# Patient Record
Sex: Female | Born: 1980
Health system: Southern US, Community
[De-identification: ages and names within clinical notes are randomized; demographics above are authoritative.]

## PROBLEM LIST (undated history)

## (undated) DIAGNOSIS — E669 Obesity, unspecified: Secondary | ICD-10-CM

## (undated) DIAGNOSIS — O280 Abnormal hematological finding on antenatal screening of mother: Secondary | ICD-10-CM

## (undated) DIAGNOSIS — D229 Melanocytic nevi, unspecified: Secondary | ICD-10-CM

## (undated) DIAGNOSIS — E559 Vitamin D deficiency, unspecified: Secondary | ICD-10-CM

## (undated) DIAGNOSIS — R87629 Unspecified abnormal cytological findings in specimens from vagina: Secondary | ICD-10-CM

## (undated) HISTORY — DX: Obesity, unspecified: E66.9

## (undated) HISTORY — DX: Melanocytic nevi, unspecified: D22.9

## (undated) HISTORY — DX: Unspecified abnormal cytological findings in specimens from vagina: R87.629

## (undated) HISTORY — DX: Vitamin D deficiency, unspecified: E55.9

## (undated) HISTORY — PX: COLPOSCOPY: SHX161

## (undated) HISTORY — DX: Abnormal hematological finding on antenatal screening of mother: O28.0

## (undated) HISTORY — PX: WISDOM TOOTH EXTRACTION: SHX21

---

## 2003-10-25 ENCOUNTER — Other Ambulatory Visit: Admission: RE | Admit: 2003-10-25 | Discharge: 2003-10-25 | Payer: Self-pay | Admitting: Dermatology

## 2005-11-05 ENCOUNTER — Encounter (INDEPENDENT_AMBULATORY_CARE_PROVIDER_SITE_OTHER): Payer: Self-pay | Admitting: Specialist

## 2005-11-05 ENCOUNTER — Other Ambulatory Visit: Admission: RE | Admit: 2005-11-05 | Discharge: 2005-11-05 | Payer: Self-pay | Admitting: Unknown Physician Specialty

## 2006-08-05 ENCOUNTER — Other Ambulatory Visit: Admission: RE | Admit: 2006-08-05 | Discharge: 2006-08-05 | Payer: Self-pay | Admitting: Unknown Physician Specialty

## 2006-08-05 ENCOUNTER — Encounter (INDEPENDENT_AMBULATORY_CARE_PROVIDER_SITE_OTHER): Payer: Self-pay | Admitting: Unknown Physician Specialty

## 2007-05-05 ENCOUNTER — Other Ambulatory Visit: Admission: RE | Admit: 2007-05-05 | Discharge: 2007-05-05 | Payer: Self-pay | Admitting: Unknown Physician Specialty

## 2009-06-23 DIAGNOSIS — D229 Melanocytic nevi, unspecified: Secondary | ICD-10-CM

## 2009-06-23 HISTORY — DX: Melanocytic nevi, unspecified: D22.9

## 2010-12-13 ENCOUNTER — Other Ambulatory Visit: Payer: Self-pay | Admitting: Obstetrics and Gynecology

## 2010-12-13 ENCOUNTER — Other Ambulatory Visit: Payer: Self-pay

## 2010-12-13 DIAGNOSIS — N63 Unspecified lump in unspecified breast: Secondary | ICD-10-CM

## 2010-12-14 ENCOUNTER — Ambulatory Visit
Admission: RE | Admit: 2010-12-14 | Discharge: 2010-12-14 | Disposition: A | Discharge status: Home / Self Care | Payer: 59 | Source: Ambulatory Visit | Attending: Obstetrics and Gynecology | Admitting: Obstetrics and Gynecology

## 2010-12-14 DIAGNOSIS — N63 Unspecified lump in unspecified breast: Secondary | ICD-10-CM

## 2011-02-16 ENCOUNTER — Emergency Department (HOSPITAL_COMMUNITY)
Admission: EM | Admit: 2011-02-16 | Discharge: 2011-02-16 | Disposition: A | Discharge status: Home / Self Care | Payer: 59 | Source: Home / Self Care | Attending: Family Medicine | Admitting: Family Medicine

## 2011-02-16 DIAGNOSIS — J310 Chronic rhinitis: Secondary | ICD-10-CM

## 2011-02-16 DIAGNOSIS — J069 Acute upper respiratory infection, unspecified: Secondary | ICD-10-CM

## 2011-02-16 NOTE — ED Provider Notes (Signed)
History     CSN: [id number]  Arrival date & time 02/16/11  1011   First MD Initiated Contact with Patient 02/16/11 1018      Chief Complaint  Patient presents with  . Sore Throat    (Consider location/radiation/quality/duration/timing/severity/associated sxs/prior treatment) HPI Comments: Jessica Levine presents for evaluation of sore throat, bilateral ear itching, congestion. She denies any fever; she thinks she has allergies, but has not been formally diagnosed.  Patient is a 30 y.o. female presenting with pharyngitis. The history is provided by the patient.  Sore Throat This is a new problem. The current episode started 2 days ago. The problem occurs constantly. The problem has not changed since onset.The symptoms are aggravated by nothing. The symptoms are relieved by nothing. She has tried nothing for the symptoms.    History reviewed. No pertinent past medical history.  History reviewed. No pertinent past surgical history.  History reviewed. No pertinent family history.  History  Substance Use Topics  . Smoking status: Never Smoker   . Smokeless tobacco: Not on file  . Alcohol Use: Yes    OB History    Grav Para Term Preterm Abortions TAB SAB Ect Mult Living                  Review of Systems  Constitutional: Negative.   HENT: Positive for congestion, sore throat and rhinorrhea. Negative for ear pain and ear discharge.        Ear itching and fullness bilaterally  Eyes: Negative.   Respiratory: Negative.  Negative for cough.   Cardiovascular: Negative.   Gastrointestinal: Negative.   Genitourinary: Negative.   Musculoskeletal: Negative.   Skin: Negative.   Neurological: Negative.     Allergies  Review of patient's allergies indicates no known allergies.  Home Medications   Current Outpatient Rx  Name Route Sig Dispense Refill  . UNKNOWN TO PATIENT  ?birth control       BP 112/76  Pulse 85  Temp(Src) 98.4 F (36.9 C) (Oral)  Resp 18  SpO2 100%  LMP  02/12/2011  Physical Exam  Nursing note and vitals reviewed. Constitutional: She is oriented to person, place, and time. She appears well-developed and well-nourished.  HENT:  Head: Normocephalic and atraumatic.  Right Ear: Tympanic membrane is retracted.  Left Ear: Tympanic membrane is retracted.  Mouth/Throat: Uvula is midline, oropharynx is clear and moist and mucous membranes are normal.  Eyes: EOM are normal.  Neck: Normal range of motion.  Pulmonary/Chest: Effort normal and breath sounds normal. She has no wheezes. She has no rhonchi.  Musculoskeletal: Normal range of motion.  Neurological: She is alert and oriented to person, place, and time.  Skin: Skin is warm and dry.  Psychiatric: Her behavior is normal.    ED Course  Procedures (including critical care time)  Labs Reviewed - No data to display No results found.   1. URI (upper respiratory infection)   2. Rhinitis       MDM  Supportive care; no rx given        Richardo Priest, MD 02/16/11 1140

## 2011-08-19 ENCOUNTER — Other Ambulatory Visit (HOSPITAL_COMMUNITY): Payer: Self-pay | Admitting: General Surgery

## 2011-08-19 ENCOUNTER — Ambulatory Visit (HOSPITAL_COMMUNITY)
Admission: RE | Admit: 2011-08-19 | Discharge: 2011-08-19 | Disposition: A | Discharge status: Home / Self Care | Payer: Self-pay | Source: Ambulatory Visit | Attending: General Surgery | Admitting: General Surgery

## 2011-08-19 DIAGNOSIS — N390 Urinary tract infection, site not specified: Secondary | ICD-10-CM

## 2013-02-25 NOTE — L&D Delivery Note (Signed)
Delivery Note At 8:51 PM a viable and healthy female was delivered via Vaginal, Spontaneous Delivery (Presentation: ; Occiput Anterior).  APGAR: 8, 9; weight -pending Placenta status: Intact, Spontaneous.  Cord: 3 vessels with the following complications: None.  Cord pH: N/A  Anesthesia: Epidural  Episiotomy: None Lacerations: 2nd degree Suture Repair: 3.0 vicryl rapide ** Est. Blood Loss (mL): 500 ml.  Cytotec 800 mcg placed rectally and then methergine 0.2 mg given IM --> Watch for pph'hage   Mom to postpartum.  Baby to Couplet care / Skin to Skin.  Rika Daughdrill R 09/17/2013, 9:30 PM

## 2013-03-12 LAB — OB RESULTS CONSOLE GC/CHLAMYDIA
Chlamydia: NEGATIVE
GC PROBE AMP, GENITAL: NEGATIVE

## 2013-03-12 LAB — OB RESULTS CONSOLE ABO/RH: RH Type: POSITIVE

## 2013-03-12 LAB — OB RESULTS CONSOLE RPR: RPR: NONREACTIVE

## 2013-03-12 LAB — OB RESULTS CONSOLE HEPATITIS B SURFACE ANTIGEN: Hepatitis B Surface Ag: NEGATIVE

## 2013-03-12 LAB — OB RESULTS CONSOLE HIV ANTIBODY (ROUTINE TESTING): HIV: NONREACTIVE

## 2013-03-12 LAB — OB RESULTS CONSOLE ANTIBODY SCREEN: Antibody Screen: NEGATIVE

## 2013-03-12 LAB — OB RESULTS CONSOLE RUBELLA ANTIBODY, IGM: Rubella: IMMUNE

## 2013-08-17 LAB — OB RESULTS CONSOLE GBS: STREP GROUP B AG: NEGATIVE

## 2013-09-14 ENCOUNTER — Telehealth (HOSPITAL_COMMUNITY): Payer: Self-pay | Admitting: *Deleted

## 2013-09-14 ENCOUNTER — Encounter (HOSPITAL_COMMUNITY): Payer: Self-pay | Admitting: *Deleted

## 2013-09-14 NOTE — Telephone Encounter (Signed)
Preadmission screen  

## 2013-09-16 ENCOUNTER — Other Ambulatory Visit: Payer: Self-pay | Admitting: Obstetrics & Gynecology

## 2013-09-17 ENCOUNTER — Inpatient Hospital Stay (HOSPITAL_COMMUNITY): Payer: 59 | Admitting: Anesthesiology

## 2013-09-17 ENCOUNTER — Encounter (HOSPITAL_COMMUNITY): Payer: 59 | Admitting: Anesthesiology

## 2013-09-17 ENCOUNTER — Inpatient Hospital Stay (HOSPITAL_COMMUNITY): Admission: AD | Admit: 2013-09-17 | Payer: 59 | Source: Ambulatory Visit | Admitting: Obstetrics & Gynecology

## 2013-09-17 ENCOUNTER — Inpatient Hospital Stay (HOSPITAL_COMMUNITY)
Admission: RE | Admit: 2013-09-17 | Discharge: 2013-09-19 | DRG: 774 | Disposition: A | Discharge status: Home / Self Care | Payer: 59 | Source: Ambulatory Visit | Attending: Obstetrics & Gynecology | Admitting: Obstetrics & Gynecology

## 2013-09-17 ENCOUNTER — Encounter (HOSPITAL_COMMUNITY): Payer: Self-pay

## 2013-09-17 DIAGNOSIS — Z3490 Encounter for supervision of normal pregnancy, unspecified, unspecified trimester: Secondary | ICD-10-CM

## 2013-09-17 DIAGNOSIS — Z833 Family history of diabetes mellitus: Secondary | ICD-10-CM

## 2013-09-17 DIAGNOSIS — Z6835 Body mass index (BMI) 35.0-35.9, adult: Secondary | ICD-10-CM

## 2013-09-17 DIAGNOSIS — E669 Obesity, unspecified: Secondary | ICD-10-CM | POA: Diagnosis present

## 2013-09-17 DIAGNOSIS — O322XX0 Maternal care for transverse and oblique lie, not applicable or unspecified: Principal | ICD-10-CM | POA: Diagnosis present

## 2013-09-17 DIAGNOSIS — O99214 Obesity complicating childbirth: Secondary | ICD-10-CM

## 2013-09-17 DIAGNOSIS — O320XX0 Maternal care for unstable lie, not applicable or unspecified: Secondary | ICD-10-CM | POA: Diagnosis present

## 2013-09-17 LAB — CBC
HEMATOCRIT: 36.8 % (ref 36.0–46.0)
Hemoglobin: 12.4 g/dL (ref 12.0–15.0)
MCH: 29.6 pg (ref 26.0–34.0)
MCHC: 33.7 g/dL (ref 30.0–36.0)
MCV: 87.8 fL (ref 78.0–100.0)
PLATELETS: 167 10*3/uL (ref 150–400)
RBC: 4.19 MIL/uL (ref 3.87–5.11)
RDW: 14.9 % (ref 11.5–15.5)
WBC: 9.8 10*3/uL (ref 4.0–10.5)

## 2013-09-17 LAB — ABO/RH: ABO/RH(D): A POS

## 2013-09-17 LAB — TYPE AND SCREEN
ABO/RH(D): A POS
ANTIBODY SCREEN: NEGATIVE

## 2013-09-17 LAB — RPR

## 2013-09-17 MED ORDER — DIBUCAINE 1 % RE OINT
1.0000 "application " | TOPICAL_OINTMENT | RECTAL | Status: DC | PRN
  Administered 2013-09-18: 1 via RECTAL
  Filled 2013-09-17: qty 28

## 2013-09-17 MED ORDER — PHENYLEPHRINE 40 MCG/ML (10ML) SYRINGE FOR IV PUSH (FOR BLOOD PRESSURE SUPPORT)
80.0000 ug | PREFILLED_SYRINGE | INTRAVENOUS | Status: DC | PRN
  Filled 2013-09-17: qty 2

## 2013-09-17 MED ORDER — SENNOSIDES-DOCUSATE SODIUM 8.6-50 MG PO TABS
2.0000 | ORAL_TABLET | ORAL | Status: DC
  Administered 2013-09-18 (×2): 2 via ORAL
  Filled 2013-09-17 (×2): qty 2

## 2013-09-17 MED ORDER — OXYTOCIN 40 UNITS IN LACTATED RINGERS INFUSION - SIMPLE MED
1.0000 m[IU]/min | INTRAVENOUS | Status: DC
  Administered 2013-09-17: 2 m[IU]/min via INTRAVENOUS
  Filled 2013-09-17: qty 1000

## 2013-09-17 MED ORDER — LACTATED RINGERS IV SOLN: 500.0000 mL | Freq: Once | INTRAVENOUS | Status: DC

## 2013-09-17 MED ORDER — IBUPROFEN 600 MG PO TABS: 600.0000 mg | ORAL_TABLET | Freq: Four times a day (QID) | ORAL | Status: DC | PRN

## 2013-09-17 MED ORDER — OXYCODONE-ACETAMINOPHEN 5-325 MG PO TABS
1.0000 | ORAL_TABLET | ORAL | Status: DC | PRN
  Administered 2013-09-18 – 2013-09-19 (×7): 1 via ORAL
  Filled 2013-09-17 (×7): qty 1

## 2013-09-17 MED ORDER — EPHEDRINE 5 MG/ML INJ
10.0000 mg | INTRAVENOUS | Status: DC | PRN
  Filled 2013-09-17: qty 2
  Filled 2013-09-17: qty 4

## 2013-09-17 MED ORDER — MISOPROSTOL 25 MCG QUARTER TABLET
25.0000 ug | ORAL_TABLET | ORAL | Status: DC | PRN
  Filled 2013-09-17: qty 1

## 2013-09-17 MED ORDER — OXYTOCIN BOLUS FROM INFUSION
500.0000 mL | INTRAVENOUS | Status: DC
  Administered 2013-09-17: 500 mL via INTRAVENOUS

## 2013-09-17 MED ORDER — ONDANSETRON HCL 4 MG PO TABS: 4.0000 mg | ORAL_TABLET | ORAL | Status: DC | PRN

## 2013-09-17 MED ORDER — LIDOCAINE HCL (PF) 1 % IJ SOLN
30.0000 mL | INTRAMUSCULAR | Status: DC | PRN
  Filled 2013-09-17: qty 30

## 2013-09-17 MED ORDER — BENZOCAINE-MENTHOL 20-0.5 % EX AERO
1.0000 | INHALATION_SPRAY | CUTANEOUS | Status: DC | PRN
Start: 2013-09-17 — End: 2013-09-19
  Administered 2013-09-18: 1 via TOPICAL
  Filled 2013-09-17: qty 56

## 2013-09-17 MED ORDER — FENTANYL 2.5 MCG/ML BUPIVACAINE 1/10 % EPIDURAL INFUSION (WH - ANES): 14.0000 mL/h | INTRAMUSCULAR | Status: DC | PRN

## 2013-09-17 MED ORDER — ONDANSETRON HCL 4 MG/2ML IJ SOLN
4.0000 mg | Freq: Four times a day (QID) | INTRAMUSCULAR | Status: DC | PRN
  Administered 2013-09-17: 4 mg via INTRAVENOUS
  Filled 2013-09-17: qty 2

## 2013-09-17 MED ORDER — WITCH HAZEL-GLYCERIN EX PADS
1.0000 | MEDICATED_PAD | CUTANEOUS | Status: DC | PRN
Start: 2013-09-17 — End: 2013-09-19

## 2013-09-17 MED ORDER — EPHEDRINE 5 MG/ML INJ
10.0000 mg | INTRAVENOUS | Status: DC | PRN
  Filled 2013-09-17: qty 2

## 2013-09-17 MED ORDER — DIPHENHYDRAMINE HCL 50 MG/ML IJ SOLN
12.5000 mg | INTRAMUSCULAR | Status: DC | PRN
  Administered 2013-09-17: 12.5 mg via INTRAVENOUS
  Filled 2013-09-17: qty 1

## 2013-09-17 MED ORDER — MISOPROSTOL 200 MCG PO TABS: 800.0000 ug | ORAL_TABLET | Freq: Once | ORAL | Status: DC

## 2013-09-17 MED ORDER — PRENATAL MULTIVITAMIN CH
1.0000 | ORAL_TABLET | Freq: Every day | ORAL | Status: DC
  Administered 2013-09-18 – 2013-09-19 (×2): 1 via ORAL
  Filled 2013-09-17 (×2): qty 1

## 2013-09-17 MED ORDER — DIPHENHYDRAMINE HCL 25 MG PO CAPS: 25.0000 mg | ORAL_CAPSULE | Freq: Four times a day (QID) | ORAL | Status: DC | PRN

## 2013-09-17 MED ORDER — LACTATED RINGERS IV SOLN: 500.0000 mL | INTRAVENOUS | Status: DC | PRN

## 2013-09-17 MED ORDER — ZOLPIDEM TARTRATE 5 MG PO TABS: 5.0000 mg | ORAL_TABLET | Freq: Every evening | ORAL | Status: DC | PRN

## 2013-09-17 MED ORDER — TETANUS-DIPHTH-ACELL PERTUSSIS 5-2.5-18.5 LF-MCG/0.5 IM SUSP: 0.5000 mL | Freq: Once | INTRAMUSCULAR | Status: DC

## 2013-09-17 MED ORDER — MISOPROSTOL 200 MCG PO TABS
ORAL_TABLET | ORAL | Status: AC
  Administered 2013-09-17: 800 ug via RECTAL
  Filled 2013-09-17: qty 5

## 2013-09-17 MED ORDER — IBUPROFEN 600 MG PO TABS
600.0000 mg | ORAL_TABLET | Freq: Four times a day (QID) | ORAL | Status: DC
  Administered 2013-09-18 – 2013-09-19 (×7): 600 mg via ORAL
  Filled 2013-09-17 (×7): qty 1

## 2013-09-17 MED ORDER — MISOPROSTOL 25 MCG QUARTER TABLET
25.0000 ug | ORAL_TABLET | ORAL | Status: DC | PRN
  Administered 2013-09-17: 25 ug via VAGINAL
  Filled 2013-09-17: qty 1
  Filled 2013-09-17: qty 0.25

## 2013-09-17 MED ORDER — TERBUTALINE SULFATE 1 MG/ML IJ SOLN
0.2500 mg | Freq: Once | INTRAMUSCULAR | Status: DC | PRN
Start: 2013-09-17 — End: 2013-09-17

## 2013-09-17 MED ORDER — OXYTOCIN 40 UNITS IN LACTATED RINGERS INFUSION - SIMPLE MED
62.5000 mL/h | INTRAVENOUS | Status: DC
  Administered 2013-09-17: 62.5 mL/h via INTRAVENOUS

## 2013-09-17 MED ORDER — TERBUTALINE SULFATE 1 MG/ML IJ SOLN: 0.2500 mg | Freq: Once | INTRAMUSCULAR | Status: DC | PRN

## 2013-09-17 MED ORDER — CITRIC ACID-SODIUM CITRATE 334-500 MG/5ML PO SOLN: 30.0000 mL | ORAL | Status: DC | PRN

## 2013-09-17 MED ORDER — METHYLERGONOVINE MALEATE 0.2 MG/ML IJ SOLN
INTRAMUSCULAR | Status: AC
  Administered 2013-09-17: 0.2 mg
  Filled 2013-09-17: qty 1

## 2013-09-17 MED ORDER — SIMETHICONE 80 MG PO CHEW
80.0000 mg | CHEWABLE_TABLET | ORAL | Status: DC | PRN
Start: 2013-09-17 — End: 2013-09-19

## 2013-09-17 MED ORDER — LACTATED RINGERS IV SOLN
INTRAVENOUS | Status: DC
  Administered 2013-09-17 (×3): via INTRAVENOUS

## 2013-09-17 MED ORDER — ACETAMINOPHEN 325 MG PO TABS: 650.0000 mg | ORAL_TABLET | ORAL | Status: DC | PRN

## 2013-09-17 MED ORDER — FLEET ENEMA 7-19 GM/118ML RE ENEM: 1.0000 | ENEMA | RECTAL | Status: DC | PRN

## 2013-09-17 MED ORDER — ONDANSETRON HCL 4 MG/2ML IJ SOLN: 4.0000 mg | INTRAMUSCULAR | Status: DC | PRN

## 2013-09-17 MED ORDER — PHENYLEPHRINE 40 MCG/ML (10ML) SYRINGE FOR IV PUSH (FOR BLOOD PRESSURE SUPPORT)
80.0000 ug | PREFILLED_SYRINGE | INTRAVENOUS | Status: DC | PRN
  Filled 2013-09-17: qty 10
  Filled 2013-09-17: qty 2

## 2013-09-17 MED ORDER — OXYCODONE-ACETAMINOPHEN 5-325 MG PO TABS: 1.0000 | ORAL_TABLET | ORAL | Status: DC | PRN

## 2013-09-17 MED ORDER — LANOLIN HYDROUS EX OINT: TOPICAL_OINTMENT | CUTANEOUS | Status: DC | PRN

## 2013-09-17 MED ORDER — FENTANYL 2.5 MCG/ML BUPIVACAINE 1/10 % EPIDURAL INFUSION (WH - ANES)
14.0000 mL/h | INTRAMUSCULAR | Status: DC | PRN
  Filled 2013-09-17: qty 125

## 2013-09-17 NOTE — Progress Notes (Signed)
Jessica Levine is a 33 y.o. G3P2002 at [redacted]w[redacted]d, IOL dor dates, high unengaged station at admission, now unstable, oblique lie. S/p Epidural and is comfortable. Decreased pitocin to allow rotation in exaggerated Sims with Peanut ball.   BP 100/55  Pulse 84  Temp(Src) 98.2 F (36.8 C) (Oral)  Resp 18  Ht 5\' 5"  (1.651 m)  Wt 215 lb (97.523 kg)  BMI 35.78 kg/m2  SpO2 96%  LMP 12/11/2012  FHT:  FHR: 150 bpm, variability: moderate,  accelerations:  Present,  decelerations:  Absent UC:   regular, every 3 minutes, now spacing out with pitocin turned down from 8 to 2 mu rate.   SVE:   Dilation: 5 Effacement (%): 60 Station: -2 Exam by:: Markeisha Mancias Vtx still above cervix but seems deflexed and off the left side. Back is on right so in case baby turned to transverse lie, cervical os should still stay covered and not allow cord prolapse, though risk present and d/w pt.  Increased C/s risk reviewed if not turning longitudinal and started entering pelvic inlet. EFW 7.1/2 lbs (last kid SVD was 8'14")   [name] R 09/17/2013, 4:55 PM

## 2013-09-17 NOTE — Progress Notes (Signed)
Jessica Levine is a 33 y.o. G3P2002 at [redacted]w[redacted]d, IOL for dates, high unengaged station at admission, control AROM, low dose pitocin, abdominal binder, then exaggerated Sim, Peanut ball, all options tried to get fetus in longitudinal lie and help descent in pelvic inlet. Epidural and is comfortable mostly but some discomfort.   VS stable. FHT:  FHR: 150 bpm, variability: moderate,  accelerations:  Present,  decelerations:  Absent - occasional decels/ variable like, no concerns UC:   regular, every 3 minutes on low dose pitocin   SVE:   Dilation: 8 Effacement (%): 80 Station: -2 Exam by:: Dr. Benjie Karvonen Vtx more central at pelvic inlet and cervix well applied!!  Continue to turn in exaggerated Sims on alternating side now to help with descent, anticipate SVD.    Jessica Levine 09/17/2013, 8:05 PM

## 2013-09-17 NOTE — H&P (Signed)
Jessica Levine is a 33 y.o. female presenting for labor IOL at 40 wks per maternal request and for oblique high stable, possible unstable lie.  Pt is G3P2002, with 8'14" SVD in past. This pregnancy care from 8 wks, abn QUAD for Down's but Infomaseq was normal. Normal labs, glucola, wt gain and GBS negative.   History OB History   Grav Para Term Preterm Abortions TAB SAB Ect Mult Living   3 2 2       2      Past Medical History  Diagnosis Date  . Obesity   . Abnormal quad screen   . Vaginal Pap smear, abnormal    Past Surgical History  Procedure Laterality Date  . Colposcopy     Family History: family history includes Diabetes in her maternal grandmother; Hearing loss in her maternal grandmother. Social History:  reports that she has never smoked. She has never used smokeless tobacco. She reports that she drinks alcohol. Her drug history is not on file.   Prenatal Transfer Tool  Maternal Diabetes: No Genetic Screening: Abnormal:  Results: Elevated risk of Trisomy 21 Maternal Ultrasounds/Referrals: Normal Fetal Ultrasounds or other Referrals:  None Maternal Substance Abuse:  No Significant Maternal Medications:  None Significant Maternal Lab Results:  Lab values include: Group B Strep negative Other Comments:  Informaseq cfFDNA negative  ROS  Neg    Dilation: 4 Effacement (%): 60 Station: -2 Exam by:: Phillipe Clemon Blood pressure 108/71, pulse 95, temperature 98.2 F (36.8 C), temperature source Oral, resp. rate 18, height 5\' 5"  (1.651 m), weight 215 lb (97.523 kg), last menstrual period 12/11/2012, SpO2 96.00%. Exam Physical Exam  A&O x 3, no acute distress. Pleasant HEENT neg, no thyromegaly Lungs CTA bilat CV RRR, S1S2 normal Abdo soft, non tender, non acute, Gravid pendulous uterus with unengaded station with oblique lie, head in LLQ and buttock in RUQ.  Extr no edema/ tenderness Pelvic 3-4, controlled AROM done with fundal pressure on the buttuck and keeping baby  longitudinal./ FHT  140s/ reactive Toco q 3-4 min with pitocin/   Belly binder placed to keep fetus more longitudinal and allow descent. Head feels unflexed, floating above and to the left of the inlet.   Prenatal labs: ABO, Rh: --/--/A POS, A POS (07/24 0740) Antibody: NEG (07/24 0740) Rubella: Immune (01/16 0000) RPR: NON REAC (07/24 0818)  HBsAg: Negative (01/16 0000)  HIV: Non-reactive (01/16 0000)  GBS: Negative (06/23 0000)   Assessment/Plan: 33 yo G3P2002 at 40 wks for labor IOL. Unstable oblique lie. Will continue to align the axis of the uterus and help decent, check for cord prolapse, not at present. Continue pitocin, tolerating well.  EFW 7.1/2 -8 lbs. Pelvic adequate for 8'14" in past.   Brannen Koppen R

## 2013-09-17 NOTE — Anesthesia Preprocedure Evaluation (Signed)
Anesthesia Evaluation    Airway       Dental   Pulmonary          Cardiovascular     Neuro/Psych    GI/Hepatic   Endo/Other  Morbid obesity  Renal/GU      Musculoskeletal   Abdominal   Peds  Hematology   Anesthesia Other Findings   Reproductive/Obstetrics                           Anesthesia Physical Anesthesia Plan  ASA: III  Anesthesia Plan: Epidural   Post-op Pain Management:    Induction:   Airway Management Planned:   Additional Equipment:   Intra-op Plan:   Post-operative Plan:   Informed Consent: I have reviewed the patients History and Physical, chart, labs and discussed the procedure including the risks, benefits and alternatives for the proposed anesthesia with the patient or authorized representative who has indicated his/her understanding and acceptance.   Dental Advisory Given  Plan Discussed with:   Anesthesia Plan Comments: (Labs checked- platelets confirmed with RN in room. Fetal heart tracing, per RN, reported to be stable enough for sitting procedure. Discussed epidural, and patient consents to the procedure:  included risk of possible headache,backache, failed block, allergic reaction, and nerve injury. This patient was asked if she had any questions or concerns before the procedure started.)        Anesthesia Quick Evaluation

## 2013-09-18 LAB — CBC
HEMATOCRIT: 36.3 % (ref 36.0–46.0)
HEMOGLOBIN: 12.3 g/dL (ref 12.0–15.0)
MCH: 29.7 pg (ref 26.0–34.0)
MCHC: 33.9 g/dL (ref 30.0–36.0)
MCV: 87.7 fL (ref 78.0–100.0)
Platelets: 170 10*3/uL (ref 150–400)
RBC: 4.14 MIL/uL (ref 3.87–5.11)
RDW: 14.8 % (ref 11.5–15.5)
WBC: 16.2 10*3/uL — AB (ref 4.0–10.5)

## 2013-09-18 MED ORDER — LIDOCAINE HCL (PF) 1 % IJ SOLN
INTRAMUSCULAR | Status: DC | PRN
  Administered 2013-09-17: 10 mL

## 2013-09-18 MED ORDER — FENTANYL 2.5 MCG/ML BUPIVACAINE 1/10 % EPIDURAL INFUSION (WH - ANES)
INTRAMUSCULAR | Status: DC | PRN
  Administered 2013-09-17: 14 mL/h via EPIDURAL

## 2013-09-18 NOTE — Anesthesia Postprocedure Evaluation (Signed)
   Post-op Vital Signs:   Last Vitals:  Filed Vitals:   09/18/13 0337  BP: 127/79  Pulse: 75  Temp: 36.8 C  Resp: 20    Complications:  Anesthesia Post Note  Patient: Jessica Levine  Procedure(s) Performed: * No procedures listed *  Anesthesia type: Epidural  Patient location: Mother/Baby  Post pain: Pain level controlled  Post assessment: Post-op Vital signs reviewed  Last Vitals:  Filed Vitals:   09/18/13 0337  BP: 127/79  Pulse: 75  Temp: 36.8 C  Resp: 20    Post vital signs: Reviewed  Level of consciousness:alert  Complications: No apparent anesthesia complications

## 2013-09-18 NOTE — Anesthesia Procedure Notes (Signed)

## 2013-09-18 NOTE — Progress Notes (Signed)
Patient ID: Jessica Levine, female   DOB: [birth year], 33 y.o.   MRN: [id number] PPD # 1 SVD  S:  Reports feeling tired, but well             Tolerating po/ No nausea or vomiting             Bleeding is light             Pain controlled with ibuprofen (OTC)             Up ad lib / ambulatory / voiding without difficulties    Newborn  Information for the patient's newborn:  Claribel, Sachs [[id number]]  female  breast feeding   O:  A & O x 3, in no apparent distress              VS:  Filed Vitals:   09/17/13 2255 09/18/13 0000 09/18/13 0337 09/18/13 1130  BP: 123/74 121/81 127/79 103/63  Pulse: 75 80 75 72  Temp: 99.9 F (37.7 C) 98.8 F (37.1 C) 98.3 F (36.8 C) 98.4 F (36.9 C)  TempSrc: Oral Oral Oral Oral  Resp: 20 20 20 18   Height:      Weight:      SpO2:        LABS:  Recent Labs  09/17/13 0818 09/18/13 0605  WBC 9.8 16.2*  HGB 12.4 12.3  HCT 36.8 36.3  PLT 167 170    Blood type: --/--/A POS, A POS (07/24 0740)  Rubella: Immune (01/16 0000)   I&O: I/O last 3 completed shifts: In: -  Out: 1800 [Urine:1300; Blood:500]             Lungs: Clear and unlabored  Heart: regular rate and rhythm / no murmurs  Abdomen: soft, non-tender, non-distended              Fundus: firm, non-tender, U-2  Perineum: 2nd degree repair healing well  Lochia: minimal  Extremities: no edema, no calf pain or tenderness, no Homans    A/P: PPD # 1  33 y.o., [id number]   Principal Problem:    Postpartum care following vaginal delivery (7/24)   Doing well - stable status  Routine post partum orders  Anticipate discharge tomorrow    Laury Deep, M, MSN, CNM 09/18/2013, 1:45 PM

## 2013-09-19 MED ORDER — IBUPROFEN 600 MG PO TABS: 600.0000 mg | ORAL_TABLET | Freq: Four times a day (QID) | ORAL | Status: DC

## 2013-09-19 MED ORDER — OXYCODONE-ACETAMINOPHEN 5-325 MG PO TABS: 1.0000 | ORAL_TABLET | ORAL | Status: DC | PRN

## 2013-09-19 NOTE — Discharge Summary (Signed)
Obstetric Discharge Summary  Reason for Admission: Pt is a G3P3003 at [redacted]w[redacted]d IOL per patient request.  Patient has received care at [facility] since 8 wks, with Dr. Benjie Karvonen as primary provider.  Medications on Admission: Prescriptions prior to admission  Medication Sig Dispense Refill  . Cholecalciferol (VITAMIN D PO) Take 1 tablet by mouth daily.      . Prenatal Vit-Fe Fumarate-FA (PRENATAL MULTIVITAMIN) TABS tablet Take 1 tablet by mouth daily at 12 noon.        Prenatal Labs: ABO, Rh: A POS, A POS (07/24 0740) Antibody: NEG (07/24 0740) Rubella: Immune (01/16 0000)   RPR: NON REAC (07/24 0818)  HBsAg: Negative (01/16 0000)  HIV: Non-reactive (01/16 0000)  GTT : 135 mg/dL GBS: Negative (06/23 0000)   Prenatal Procedures: NST and ultrasound Intrapartum Procedures: spontaneous vaginal delivery Postpartum Procedures: none Complications-Operative and Postpartum: 2nd degree perineal laceration and hemorrhage - EBL 500 mL  Labs: Hemoglobin  Date Value Ref Range Status  09/18/2013 12.3  12.0 - 15.0 g/dL Final     HCT  Date Value Ref Range Status  09/18/2013 36.3  36.0 - 46.0 % Final   Lab Results  Component Value Date   PLT 170 09/18/2013    Newborn Data: Live born female on 09/17/2013 Birth Weight: 9 lb 1.9 oz (4135 g) APGAR: 8, 9  Home with mother.   Discharge Information: Date: 09/19/2013 Discharge Diagnoses:  Pt is a G3P3003 at [redacted]w[redacted]d S/P Term Pregnancy-delivered on 09/17/2013  Condition: stable Activity: pelvic rest Diet: routine Medications:    Medication List         ibuprofen 600 MG tablet  Commonly known as:  ADVIL,MOTRIN  Take 1 tablet (600 mg total) by mouth every 6 (six) hours.     prenatal multivitamin Tabs tablet  Take 1 tablet by mouth daily at 12 noon.     VITAMIN D PO  Take 1 tablet by mouth daily.       Instructions: [facility] OB/GYN instruction booklet has been given and reviewed Discharge to: home     Follow-up Information   Follow up with MODY,VAISHALI R, MD. Schedule an appointment as soon as possible for a visit in 6 weeks. (For postpartum visit)    Specialty:  Obstetrics and Gynecology   Contact information:   [address] [place] [address] [phone]       Laury Deep, Jerilynn Mages, MSN, CNM 09/19/2013, 7:23 AM

## 2013-09-19 NOTE — Discharge Instructions (Signed)
Breast Pumping Tips °If you are breastfeeding, there may be times when you cannot feed your baby directly. Returning to work or going on a trip are common examples. Pumping allows you to store breast milk and feed it to your baby later.  °You may not get much milk when you first start to pump. Your breasts should start to make more after a few days. If you pump at the times you usually feed your baby, you may be able to keep making enough milk to feed your baby without also using formula. The more often you pump, the more milk you will produce.  °WHEN SHOULD I PUMP?  °· You can begin to pump soon after delivery. However, some experts recommend waiting about 4 weeks before giving your infant a bottle to make sure breastfeeding is going well.  °· If you plan to return to work, begin pumping a few weeks before. This will help you develop techniques that work best for you. It also lets you build up a supply of breast milk.   °· When you are with your infant, feed on demand and pump after each feeding.   °· When you are away from your infant for several hours, pump for about 15 minutes every 2-3 hours. Pump both breasts at the same time if you can.   °· If your infant has a formula feeding, make sure to pump around the same time.     °· If you drink any alcohol, wait 2 hours before pumping.   °HOW DO I PREPARE TO PUMP? °Your let-down reflex is the natural reaction to stimulation that makes your breast milk flow. It is easier to stimulate this reflex when you are relaxed. Find relaxation techniques that work for you. If you have difficulty with your let-down reflex, try these methods:  °· Smell one of your infant's blankets or an item of clothing.   °· Look at a picture or video of your infant.   °· Sit in a quiet, private space.   °· Massage the breast you plan to pump.   °· Place soothing warmth on the breast.   °· Play relaxing music.   °WHAT ARE SOME GENERAL BREAST PUMPING TIPS? °· Wash your hands before you pump. You  do not need to wash your nipples or breasts. °· There are three ways to pump. °¨ You can use your hand to massage and compress your breast. °¨ You can use a handheld manual pump. °¨ You can use an electric pump.   °· Make sure the suction cup (flange) on the breast pump is the right size. Place the flange directly over the nipple. If it is the wrong size or placed the wrong way, it may be painful and cause nipple damage.   °· If pumping is uncomfortable, apply a small amount of purified or modified lanolin to your nipple and areola. °· If you are using an electric pump, adjust the speed and suction power to be more comfortable. °· If pumping is painful or if you are not getting very much milk, you may need a different type of pump. A lactation consultant can help you determine what type of pump to use.   °· Keep a full water bottle near you at all times. Drinking lots of fluid helps you make more milk.  °· You can store your milk to use later. Pumped breast milk can be stored in a sealable, sterile container or plastic bag. Label all stored breast milk with the date you pumped it. °¨ Milk can stay out at room temperature for up to 8 hours. °¨   You can store your milk in the refrigerator for up to 8 days. °¨ You can store your milk in the freezer for 3 months. Thaw frozen milk using warm water. Do not put it in the microwave. °· Do not smoke. Smoking can lower your milk supply and harm your infant. If you need help quitting, ask your health care provider to recommend a program.   °WHEN SHOULD I CALL MY HEALTH CARE PROVIDER OR A LACTATION CONSULTANT? °· You are having trouble pumping. °· You are concerned that you are not making enough milk. °· You have nipple pain, soreness, or redness. °· You want to use birth control. Birth control pills may lower your milk supply. Talk to your health care provider about your options. °Document Released: 08/01/2009 Document Revised: 02/16/2013 Document Reviewed:  12/04/2012 °Exit[employer]® Patient Information ©2015 [employer]. This information is not intended to replace advice given to you by your health care provider. Make sure you discuss any questions you have with your health care provider. ° °Nutrition for the New Mother  °A new mother needs good health and nutrition so she can have energy to take care of a new baby. Whether a mother breastfeeds or formula feeds the baby, it is important to have a well-balanced diet. Foods from all the food groups should be chosen to meet the new mother's energy needs and to give her the nutrients needed for repair and healing.  °A HEALTHY EATING PLAN °The My [employer] plan for Moms outlines what you should eat to help you and your baby stay healthy. The energy and amount of food you need depends on whether or not you are breastfeeding. If you are breastfeeding you will need more nutrients. If you choose not to breastfeed, your nutrition goal should be to return to a healthy weight. Limiting calories may be needed if you are not breastfeeding.  °HOME CARE INSTRUCTIONS  °· For a personal plan based on your unique needs, see your Registered Dietitian or visit www.mypyramid.gov. °· Eat a variety of foods. The plan below will help guide you. The following chart has a suggested daily meal plan from the [employer] for Moms. °· Eat a variety of fruits and vegetables. °· Eat more dark green and orange vegetables and cooked dried beans. °· Make half your grains whole grains. Choose whole instead of refined grains. °· Choose low-fat or lean meats and poultry. °· Choose low-fat or fat-free dairy products like milk, cheese, or yogurt. °Fruits °· Breastfeeding: 2 cups °· Non-Breastfeeding: 2 cups °· What Counts as a serving? °¨ 1 cup of fruit or juice. °¨ ½ cup dried fruit. °Vegetables °· Breastfeeding: 3 cups °· Non-Breastfeeding: 2 ½ cups °· What Counts as a serving? °¨ 1 cup raw or cooked vegetables. °¨ Juice or 2 cups raw leafy  vegetables. °Grains °· Breastfeeding: 8 oz °· Non-Breastfeeding: 6 oz °· What Counts as a serving? °¨ 1 slice bread. °¨ 1 oz ready-to-eat cereal. °¨ ½ cup cooked pasta, rice, or cereal. °Meat and Beans °· Breastfeeding: 6 ½ oz °· Non-Breastfeeding: 5 ½ oz °· What Counts as a serving? °¨ 1 oz lean meat, poultry, or fish °¨ ¼ cup cooked dry beans °¨ ½ oz nuts or 1 egg °¨ 1 tbs peanut butter °Milk °· Breastfeeding: 3 cups °· Non-Breastfeeding: 3 cups °· What Counts as a serving? °¨ 1 cup milk. °¨ 8 oz yogurt. °¨ 1 ½ oz cheese. °¨ 2 oz processed cheese. °TIPS FOR THE BREASTFEEDING MOM °· Rapid weight   loss is not suggested when you are breastfeeding. By simply breastfeeding, you will be able to lose the weight gained during your pregnancy. Your caregiver can keep track of your weight and tell you if your weight loss is appropriate. °· Be sure to drink fluids. You may notice that you are thirstier than usual. A suggestion is to drink a glass of water or other beverage whenever you breastfeed. °· Avoid alcohol as it can be passed into your breast milk. °· Limit caffeine drinks to no more than 2 to 3 cups per day. °· You may need to keep taking your prenatal vitamin while you are breastfeeding. Talk with your caregiver about taking a vitamin or supplement. °RETURING TO A HEALTHY WEIGHT °· The My [employer] Plan for Moms will help you return to a healthy weight. It will also provide the nutrients you need. °· You may need to limit "empty" calories. These include: °¨ High fat foods like fried foods, fatty meats, fast food, butter, and mayonnaise. °¨ High sugar foods like sodas, jelly, candy, and sweets. °· Be physically active. Include 30 minutes of exercise or more each day. Choose an activity you like such as walking, swimming, biking, or aerobics. Check with your caregiver before you start to exercise. °Document Released: 05/21/2007 Document Revised: 05/06/2011 Document Reviewed: 05/21/2007 °ExitCare® Patient Information  ©2015 [employer]. This information is not intended to replace advice given to you by your health care provider. Make sure you discuss any questions you have with your health care provider. °Postpartum Depression and Baby Blues °The postpartum period begins right after the birth of a baby. During this time, there is often a great amount of joy and excitement. It is also a time of many changes in the life of the parents. Regardless of how many times a mother gives birth, each child brings new challenges and dynamics to the family. It is not unusual to have feelings of excitement along with confusing shifts in moods, emotions, and thoughts. All mothers are at risk of developing postpartum depression or the "baby blues." These mood changes can occur right after giving birth, or they may occur many months after giving birth. The baby blues or postpartum depression can be mild or severe. Additionally, postpartum depression can go away rather quickly, or it can be a long-term condition.  °CAUSES °Raised hormone levels and the rapid drop in those levels are thought to be a main cause of postpartum depression and the baby blues. A number of hormones change during and after pregnancy. Estrogen and progesterone usually decrease right after the delivery of your baby. The levels of thyroid hormone and various cortisol steroids also rapidly drop. Other factors that play a role in these mood changes include major life events and genetics.  °RISK FACTORS °If you have any of the following risks for the baby blues or postpartum depression, know what symptoms to watch out for during the postpartum period. Risk factors that may increase the likelihood of getting the baby blues or postpartum depression include: °· Having a personal or family history of depression.   °· Having depression while being pregnant.   °· Having premenstrual mood issues or mood issues related to oral contraceptives. °· Having a lot of life stress.   °· Having  marital conflict.   °· Lacking a social support network.   °· Having a baby with special needs.   °· Having health problems, such as diabetes.   °SIGNS AND SYMPTOMS °Symptoms of baby blues include: °· Brief changes in mood, such as going   from extreme happiness to sadness. °· Decreased concentration.   °· Difficulty sleeping.   °· Crying spells, tearfulness.   °· Irritability.   °· Anxiety.   °Symptoms of postpartum depression typically begin within the first month after giving birth. These symptoms include: °· Difficulty sleeping or excessive sleepiness.   °· Marked weight loss.   °· Agitation.   °· Feelings of worthlessness.   °· Lack of interest in activity or food.   °Postpartum psychosis is a very serious condition and can be dangerous. Fortunately, it is rare. Displaying any of the following symptoms is cause for immediate medical attention. Symptoms of postpartum psychosis include:  °· Hallucinations and delusions.   °· Bizarre or disorganized behavior.   °· Confusion or disorientation.   °DIAGNOSIS  °A diagnosis is made by an evaluation of your symptoms. There are no medical or lab tests that lead to a diagnosis, but there are various questionnaires that a health care provider may use to identify those with the baby blues, postpartum depression, or psychosis. Often, a screening tool called the Edinburgh Postnatal Depression Scale is used to diagnose depression in the postpartum period.  °TREATMENT °The baby blues usually goes away on its own in 1-2 weeks. Social support is often all that is needed. You will be encouraged to get adequate sleep and rest. Occasionally, you may be given medicines to help you sleep.  °Postpartum depression requires treatment because it can last several months or longer if it is not treated. Treatment may include individual or group therapy, medicine, or both to address any social, physiological, and psychological factors that may play a role in the depression. Regular exercise, a  healthy diet, rest, and social support may also be strongly recommended.  °Postpartum psychosis is more serious and needs treatment right away. Hospitalization is often needed. °HOME CARE INSTRUCTIONS °· Get as much rest as you can. Nap when the baby sleeps.   °· Exercise regularly. Some women find yoga and walking to be beneficial.   °· Eat a balanced and nourishing diet.   °· Do little things that you enjoy. Have a cup of tea, take a bubble bath, read your favorite magazine, or listen to your favorite music. °· Avoid alcohol.   °· Ask for help with household chores, cooking, grocery shopping, or running errands as needed. Do not try to do everything.   °· Talk to people close to you about how you are feeling. Get support from your partner, family members, friends, or other new moms. °· Try to stay positive in how you think. Think about the things you are grateful for.   °· Do not spend a lot of time alone.   °· Only take over-the-counter or prescription medicine as directed by your health care provider. °· Keep all your postpartum appointments.   °· Let your health care provider know if you have any concerns.   °SEEK MEDICAL CARE IF: °You are having a reaction to or problems with your medicine. °SEEK IMMEDIATE MEDICAL CARE IF: °· You have suicidal feelings.   °· You think you may harm the baby or someone else. °MAKE SURE YOU: °· Understand these instructions. °· Will watch your condition. °· Will get help right away if you are not doing well or get worse. °Document Released: 11/16/2003 Document Revised: 02/16/2013 Document Reviewed: 11/23/2012 °Exit[employer]® Patient Information ©2015 [employer]. This information is not intended to replace advice given to you by your health care provider. Make sure you discuss any questions you have with your health care provider. °Breastfeeding and Mastitis °Mastitis is inflammation of the breast tissue. It can occur in women who   are breastfeeding. This can make breastfeeding  painful. Mastitis will sometimes go away on its own. Your health care provider will help determine if treatment is needed. °CAUSES °Mastitis is often associated with a blocked milk (lactiferous) duct. This can happen when too much milk builds up in the breast. Causes of excess milk in the breast can include: °· Poor latch-on. If your baby is not latched onto the breast properly, she or he may not empty your breast completely while breastfeeding. °· Allowing too much time to pass between feedings. °· Wearing a bra or other clothing that is too tight. This puts extra pressure on the lactiferous ducts so milk does not flow through them as it should. °Mastitis can also be caused by a bacterial infection. Bacteria may enter the breast tissue through cuts or openings in the skin. In women who are breastfeeding, this may occur because of cracked or irritated skin. Cracks in the skin are often caused when your baby does not latch on properly to the breast. °SIGNS AND SYMPTOMS °· Swelling, redness, tenderness, and pain in an area of the breast. °· Swelling of the glands under the arm on the same side. °· Fever may or may not accompany mastitis. °If an infection is allowed to progress, a collection of pus (abscess) may develop. °DIAGNOSIS  °Your health care provider can usually diagnose mastitis based on your symptoms and a physical exam. Tests may be done to help confirm the diagnosis. These may include: °· Removal of pus from the breast by applying pressure to the area. This pus can be examined in the lab to determine which bacteria are present. If an abscess has developed, the fluid in the abscess can be removed with a needle. This can also be used to confirm the diagnosis and determine the bacteria present. In most cases, pus will not be present. °· Blood tests to determine if your body is fighting a bacterial infection. °· Mammogram or ultrasound tests to rule out other problems or diseases. °TREATMENT  °Mastitis that  occurs with breastfeeding will sometimes go away on its own. Your health care provider may choose to wait 24 hours after first seeing you to decide whether a prescription medicine is needed. If your symptoms are worse after 24 hours, your health care provider will likely prescribe an antibiotic medicine to treat the mastitis. He or she will determine which bacteria are most likely causing the infection and will then select an appropriate antibiotic medicine. This is sometimes changed based on the results of tests performed to identify the bacteria, or if there is no response to the antibiotic medicine selected. Antibiotic medicines are usually given by mouth. You may also be given medicine for pain. °HOME CARE INSTRUCTIONS °· Only take over-the-counter or prescription medicines for pain, fever, or discomfort as directed by your health care provider. °· If your health care provider prescribed an antibiotic medicine, take the medicine as directed. Make sure you finish it even if you start to feel better. °· Do not wear a tight or underwire bra. Wear a soft, supportive bra. °· Increase your fluid intake, especially if you have a fever. °· Continue to empty the breast. Your health care provider can tell you whether this milk is safe for your infant or needs to be thrown out. You may be told to stop nursing until your health care provider thinks it is safe for your baby. Use a breast pump if you are advised to stop nursing. °· Keep your nipples   clean and dry. °· Empty the first breast completely before going to the other breast. If your baby is not emptying your breasts completely for some reason, use a breast pump to empty your breasts. °· If you go back to work, pump your breasts while at work to stay in time with your nursing schedule. °· Avoid allowing your breasts to become overly filled with milk (engorged). °SEEK MEDICAL CARE IF: °· You have pus-like discharge from the breast. °· Your symptoms do not improve with  the treatment prescribed by your health care provider within 2 days. °SEEK IMMEDIATE MEDICAL CARE IF: °· Your pain and swelling are getting worse. °· You have pain that is not controlled with medicine. °· You have a red line extending from the breast toward your armpit. °· You have a fever or persistent symptoms for more than 2-3 days. °· You have a fever and your symptoms suddenly get worse. °MAKE SURE YOU:  °· Understand these instructions. °· Will watch your condition. °· Will get help right away if you are not doing well or get worse. °Document Released: 06/08/2004 Document Revised: 02/16/2013 Document Reviewed: 09/17/2012 °[employer]® Patient Information ©2015 [employer]. This information is not intended to replace advice given to you by your health care provider. Make sure you discuss any questions you have with your health care provider. °Breastfeeding °Deciding to breastfeed is one of the best choices you can make for you and your baby. A change in hormones during pregnancy causes your breast tissue to grow and increases the number and size of your milk ducts. These hormones also allow proteins, sugars, and fats from your blood supply to make breast milk in your milk-producing glands. Hormones prevent breast milk from being released before your baby is born as well as prompt milk flow after birth. Once breastfeeding has begun, thoughts of your baby, as well as his or her sucking or crying, can stimulate the release of milk from your milk-producing glands.  °BENEFITS OF BREASTFEEDING °For Your Baby °· Your first milk (colostrum) helps your baby's digestive system function better.   °· There are antibodies in your milk that help your baby fight off infections.   °· Your baby has a lower incidence of asthma, allergies, and sudden infant death syndrome.   °· The nutrients in breast milk are better for your baby than infant formulas and are designed uniquely for your baby's needs.   °· Breast milk improves your  baby's brain development.   °· Your baby is less likely to develop other conditions, such as childhood obesity, asthma, or type 2 diabetes mellitus.   °For You  °· Breastfeeding helps to create a very special bond between you and your baby.   °· Breastfeeding is convenient. Breast milk is always available at the correct temperature and costs nothing.   °· Breastfeeding helps to burn calories and helps you lose the weight gained during pregnancy.   °· Breastfeeding makes your uterus contract to its prepregnancy size faster and slows bleeding (lochia) after you give birth.   °· Breastfeeding helps to lower your risk of developing type 2 diabetes mellitus, osteoporosis, and breast or ovarian cancer later in life. °SIGNS THAT YOUR BABY IS HUNGRY °Early Signs of Hunger  °· Increased alertness or activity. °· Stretching. °· Movement of the head from side to side. °· Movement of the head and opening of the mouth when the corner of the mouth or cheek is stroked (rooting). °· Increased sucking sounds, smacking lips, cooing, sighing, or squeaking. °· Hand-to-mouth movements. °· Increased sucking of   fingers or hands. °Late Signs of Hunger °· Fussing. °· Intermittent crying. °Extreme Signs of Hunger °Signs of extreme hunger will require calming and consoling before your baby will be able to breastfeed successfully. Do not wait for the following signs of extreme hunger to occur before you initiate breastfeeding:   °· Restlessness. °· A loud, strong cry. °·  Screaming. °BREASTFEEDING BASICS °Breastfeeding Initiation °· Find a comfortable place to sit or lie down, with your neck and back well supported. °· Place a pillow or rolled up blanket under your baby to bring him or her to the level of your breast (if you are seated). Nursing pillows are specially designed to help support your arms and your baby while you breastfeed. °· Make sure that your baby's abdomen is facing your abdomen.   °· Gently massage your breast. With your  fingertips, massage from your chest wall toward your nipple in a circular motion. This encourages milk flow. You may need to continue this action during the feeding if your milk flows slowly. °· Support your breast with 4 fingers underneath and your thumb above your nipple. Make sure your fingers are well away from your nipple and your baby's mouth.   °· Stroke your baby's lips gently with your finger or nipple.   °· When your baby's mouth is open wide enough, quickly bring your baby to your breast, placing your entire nipple and as much of the colored area around your nipple (areola) as possible into your baby's mouth.   °¨ More areola should be visible above your baby's upper lip than below the lower lip.   °¨ Your baby's tongue should be between his or her lower gum and your breast.   °· Ensure that your baby's mouth is correctly positioned around your nipple (latched). Your baby's lips should create a seal on your breast and be turned out (everted). °· It is common for your baby to suck about 2-3 minutes in order to start the flow of breast milk. °Latching °Teaching your baby how to latch on to your breast properly is very important. An improper latch can cause nipple pain and decreased milk supply for you and poor weight gain in your baby. Also, if your baby is not latched onto your nipple properly, he or she may swallow some air during feeding. This can make your baby fussy. Burping your baby when you switch breasts during the feeding can help to get rid of the air. However, teaching your baby to latch on properly is still the best way to prevent fussiness from swallowing air while breastfeeding. °Signs that your baby has successfully latched on to your nipple:    °· Silent tugging or silent sucking, without causing you pain.   °· Swallowing heard between every 3-4 sucks.   °·  Muscle movement above and in front of his or her ears while sucking.   °Signs that your baby has not successfully latched on to  nipple:  °· Sucking sounds or smacking sounds from your baby while breastfeeding. °· Nipple pain. °If you think your baby has not latched on correctly, slip your finger into the corner of your baby's mouth to break the suction and place it between your baby's gums. Attempt breastfeeding initiation again. °Signs of Successful Breastfeeding °Signs from your baby:   °· A gradual decrease in the number of sucks or complete cessation of sucking.   °· Falling asleep.   °· Relaxation of his or her body.   °· Retention of a small amount of milk in his or her mouth.   °· Letting go   of your breast by himself or herself. °Signs from you: °· Breasts that have increased in firmness, weight, and size 1-3 hours after feeding.   °· Breasts that are softer immediately after breastfeeding. °· Increased milk volume, as well as a change in milk consistency and color by the fifth day of breastfeeding.   °· Nipples that are not sore, cracked, or bleeding. °Signs That Your Baby is Getting Enough Milk °· Wetting at least 3 diapers in a 24-hour period. The urine should be clear and pale yellow by age 5 days. °· At least 3 stools in a 24-hour period by age 5 days. The stool should be soft and yellow. °· At least 3 stools in a 24-hour period by age 7 days. The stool should be seedy and yellow. °· No loss of weight greater than 10% of birth weight during the first 3 days of age. °· Average weight gain of 4-7 ounces (113-198 g) per week after age 4 days. °· Consistent daily weight gain by age 5 days, without weight loss after the age of 2 weeks. °After a feeding, your baby may spit up a small amount. This is common. °BREASTFEEDING FREQUENCY AND DURATION °Frequent feeding will help you make more milk and can prevent sore nipples and breast engorgement. Breastfeed when you feel the need to reduce the fullness of your breasts or when your baby shows signs of hunger. This is called "breastfeeding on demand." Avoid introducing a pacifier to your  baby while you are working to establish breastfeeding (the first 4-6 weeks after your baby is born). After this time you may choose to use a pacifier. Research has shown that pacifier use during the first year of a baby's life decreases the risk of sudden infant death syndrome (SIDS). °Allow your baby to feed on each breast as long as he or she wants. Breastfeed until your baby is finished feeding. When your baby unlatches or falls asleep while feeding from the first breast, offer the second breast. Because newborns are often sleepy in the first few weeks of life, you may need to awaken your baby to get him or her to feed. °Breastfeeding times will vary from baby to baby. However, the following rules can serve as a guide to help you ensure that your baby is properly fed: °· Newborns (babies 4 weeks of age or younger) may breastfeed every 1-3 hours. °· Newborns should not go longer than 3 hours during the day or 5 hours during the night without breastfeeding. °· You should breastfeed your baby a minimum of 8 times in a 24-hour period until you begin to introduce solid foods to your baby at around 6 months of age. °BREAST MILK PUMPING °Pumping and storing breast milk allows you to ensure that your baby is exclusively fed your breast milk, even at times when you are unable to breastfeed. This is especially important if you are going back to work while you are still breastfeeding or when you are not able to be present during feedings. Your lactation consultant can give you guidelines on how long it is safe to store breast milk.  °A breast pump is a machine that allows you to pump milk from your breast into a sterile bottle. The pumped breast milk can then be stored in a refrigerator or freezer. Some breast pumps are operated by hand, while others use electricity. Ask your lactation consultant which type will work best for you. Breast pumps can be purchased, but some hospitals and breastfeeding support groups   lease  breast pumps on a monthly basis. A lactation consultant can teach you how to hand express breast milk, if you prefer not to use a pump.  °CARING FOR YOUR BREASTS WHILE YOU BREASTFEED °Nipples can become dry, cracked, and sore while breastfeeding. The following recommendations can help keep your breasts moisturized and healthy: °· Avoid using soap on your nipples.   °· Wear a supportive bra. Although not required, special nursing bras and tank tops are designed to allow access to your breasts for breastfeeding without taking off your entire bra or top. Avoid wearing underwire-style bras or extremely tight bras. °· Air dry your nipples for 3-4 minutes after each feeding.   °· Use only cotton bra pads to absorb leaked breast milk. Leaking of breast milk between feedings is normal.   °· Use lanolin on your nipples after breastfeeding. Lanolin helps to maintain your skin's normal moisture barrier. If you use pure lanolin, you do not need to wash it off before feeding your baby again. Pure lanolin is not toxic to your baby. You may also hand express a few drops of breast milk and gently massage that milk into your nipples and allow the milk to air dry. °In the first few weeks after giving birth, some women experience extremely full breasts (engorgement). Engorgement can make your breasts feel heavy, warm, and tender to the touch. Engorgement peaks within 3-5 days after you give birth. The following recommendations can help ease engorgement: °· Completely empty your breasts while breastfeeding or pumping. You may want to start by applying warm, moist heat (in the shower or with warm water-soaked hand towels) just before feeding or pumping. This increases circulation and helps the milk flow. If your baby does not completely empty your breasts while breastfeeding, pump any extra milk after he or she is finished. °· Wear a snug bra (nursing or regular) or tank top for 1-2 days to signal your body to slightly decrease milk  production. °· Apply ice packs to your breasts, unless this is too uncomfortable for you. °· Make sure that your baby is latched on and positioned properly while breastfeeding. °If engorgement persists after 48 hours of following these recommendations, contact your health care provider or a lactation consultant. °OVERALL HEALTH CARE RECOMMENDATIONS WHILE BREASTFEEDING °· Eat healthy foods. Alternate between meals and snacks, eating 3 of each per day. Because what you eat affects your breast milk, some of the foods may make your baby more irritable than usual. Avoid eating these foods if you are sure that they are negatively affecting your baby. °· Drink milk, fruit juice, and water to satisfy your thirst (about 10 glasses a day).   °· Rest often, relax, and continue to take your prenatal vitamins to prevent fatigue, stress, and anemia. °· Continue breast self-awareness checks. °· Avoid chewing and smoking tobacco. °· Avoid alcohol and drug use. °Some medicines that may be harmful to your baby can pass through breast milk. It is important to ask your health care provider before taking any medicine, including all over-the-counter and prescription medicine as well as vitamin and herbal supplements. °It is possible to become pregnant while breastfeeding. If birth control is desired, ask your health care provider about options that will be safe for your baby. °SEEK MEDICAL CARE IF:  °· You feel like you want to stop breastfeeding or have become frustrated with breastfeeding. °· You have painful breasts or nipples. °· Your nipples are cracked or bleeding. °· Your breasts are red, tender, or warm. °· You have   a swollen area on either breast. °· You have a fever or chills. °· You have nausea or vomiting. °· You have drainage other than breast milk from your nipples. °· Your breasts do not become full before feedings by the fifth day after you give birth. °· You feel sad and depressed. °· Your baby is too sleepy to eat  well. °· Your baby is having trouble sleeping.   °· Your baby is wetting less than 3 diapers in a 24-hour period. °· Your baby has less than 3 stools in a 24-hour period. °· Your baby's skin or the [place] part of his or her eyes becomes yellow.   °· Your baby is not gaining weight by 5 days of age. °SEEK IMMEDIATE MEDICAL CARE IF:  °· Your baby is overly tired (lethargic) and does not want to wake up and feed. °· Your baby develops an unexplained fever. °Document Released: 02/11/2005 Document Revised: 02/16/2013 Document Reviewed: 08/05/2012 °ExitCare® Patient Information ©2015 [employer]. This information is not intended to replace advice given to you by your health care provider. Make sure you discuss any questions you have with your health care provider. ° °

## 2013-09-19 NOTE — Progress Notes (Signed)
Patient ID: Jessica Levine, female   DOB: [birth year], 32 y.o.   MRN: [id number] PPD # 2 SVD  S:  Reports feeling tired, but well             Tolerating po/ No nausea or vomiting             Bleeding is light             Pain controlled with ibuprofen (OTC)             Up ad lib / ambulatory / voiding without difficulties    Newborn  Information for the patient's newborn:  Hudson, Lehmkuhl [[id number]]  female   breast feeding   O:  A & O x 3, in no apparent distress              VS:  Filed Vitals:   09/18/13 0337 09/18/13 1130 09/18/13 1820 09/19/13 0538  BP: 127/79 103/63 117/69 114/79  Pulse: 75 72 80 64  Temp: 98.3 F (36.8 C) 98.4 F (36.9 C) 98.2 F (36.8 C) 98.1 F (36.7 C)  TempSrc: Oral Oral Oral Oral  Resp: 20 18 20 18   Height:      Weight:      SpO2:    99%    LABS:   Recent Labs  09/17/13 0818 09/18/13 0605  WBC 9.8 16.2*  HGB 12.4 12.3  HCT 36.8 36.3  PLT 167 170    Blood type: --/--/A POS, A POS (07/24 0740)  Rubella: Immune (01/16 0000)   I&O: I/O last 3 completed shifts: In: -  Out: 1800 [Urine:1300; Blood:500]             Lungs: Clear and unlabored  Heart: regular rate and rhythm / no murmurs  Abdomen: soft, non-tender, non-distended              Fundus: firm, non-tender, U-2  Perineum: 2nd degree repair healing well  Lochia: minimal  Extremities: no edema, no calf pain or tenderness, no Homans    A/P: PPD # 2  33 y.o., [id number]   Principal Problem:    Postpartum care following vaginal delivery (7/24)   Doing well - stable status  Routine post partum orders  DC home today    Laury Deep, M, MSN, CNM 09/19/2013, 7:13 AM

## 2013-09-19 NOTE — Discharge Summary (Signed)
Reviewed and agree with note and plan. V.Jouri Threat, MD  

## 2013-09-22 NOTE — Progress Notes (Signed)
Ur chart review completed.  

## 2013-12-27 ENCOUNTER — Encounter (HOSPITAL_COMMUNITY): Payer: Self-pay

## 2014-10-26 ENCOUNTER — Telehealth: Payer: 59 | Admitting: Family

## 2014-10-26 DIAGNOSIS — J019 Acute sinusitis, unspecified: Secondary | ICD-10-CM | POA: Diagnosis not present

## 2014-10-26 MED ORDER — AMOXICILLIN 875 MG PO TABS: 875.0000 mg | ORAL_TABLET | Freq: Two times a day (BID) | ORAL | Status: DC

## 2014-10-26 NOTE — Progress Notes (Signed)
We are sorry that you are not feeling well.  Here is how we plan to help!  Based on what you have shared with me it looks like you have sinusitis.  Sinusitis is inflammation and infection in the sinus cavities of the head.  Based on your presentation I believe you most likely have Acute Bacterial sinusitis.  This is an infection caused by bacteria and is treated with antibiotics.  I have prescribed Amoxicillin, an antibiotic in the penicillin family, one tablet twice daily with food, for 7 days.. You may use an oral decongestant such as Mucinex D or if you have glaucoma or high blood pressure use plain Mucinex.  Saline nasal sprays help and can safely be used as often as needed for congestion. If you develop worsening sinus pain, fever or notice severe headache and vision changes, or if symptoms are not better after completion of antibiotic, please schedule an appointment with a health care provider.  Sinus infections are not as easily transmitted as other respiratory infection, however we still recommend that you avoid close contact with loved ones, especially the very young and elderly.  Remember to wash your hands thoroughly throughout the day as this is the number one way to prevent the spread of infection!  Home Care:  Only take medications as instructed by your medical team.  Complete the entire course of an antibiotic.  Do not take these medications with alcohol.  A steam or ultrasonic humidifier can help congestion.  You can place a towel over your head and breathe in the steam from hot water coming from a faucet.  Avoid close contacts especially the very young and the elderly.  Cover your mouth when you cough or sneeze.  Always remember to wash your hands.  Get Help Right Away If:  You develop worsening fever or sinus pain.  You develop a severe head ache or visual changes.  Your symptoms persist after you have completed your treatment plan.  Make sure you  Understand these  instructions.  Will watch your condition.  Will get help right away if you are not doing well or get worse.  Your e-visit answers were reviewed by a board certified advanced clinical practitioner to complete your personal care plan.  Depending on the condition, your plan could have included both over the counter or prescription medications.  If there is a problem please reply  once you have received a response from your provider.  Your safety is important to [place].  If you have drug allergies check your prescription carefully.    You can use MyChart to ask questions about today's visit, request a non-urgent call back, or ask for a work or school excuse for 24 hours related to this e-Visit. If it has been greater than 24 hours you will need to follow up with your provider, or enter a new e-Visit to address those concerns.  You will get an e-mail in the next two days asking about your experience.  I hope that your e-visit has been valuable and will speed your recovery. Thank you for using e-visits.

## 2015-03-13 DIAGNOSIS — Z01419 Encounter for gynecological examination (general) (routine) without abnormal findings: Secondary | ICD-10-CM | POA: Diagnosis not present

## 2015-03-24 DIAGNOSIS — D239 Other benign neoplasm of skin, unspecified: Secondary | ICD-10-CM | POA: Diagnosis not present

## 2015-04-12 DIAGNOSIS — J0101 Acute recurrent maxillary sinusitis: Secondary | ICD-10-CM | POA: Diagnosis not present

## 2015-04-27 DIAGNOSIS — B373 Candidiasis of vulva and vagina: Secondary | ICD-10-CM | POA: Diagnosis not present

## 2015-04-27 MED FILL — FLUCONAZOLE 150 MG TABLET: 150 | 7 days supply | Qty: 3 | Fill #0

## 2015-04-27 MED FILL — NYSTATIN-TRIAMCINOLONE OINT: 100000-0.1 | 15 days supply | Qty: 30 | Fill #0

## 2015-05-29 DIAGNOSIS — H119 Unspecified disorder of conjunctiva: Secondary | ICD-10-CM | POA: Diagnosis not present

## 2015-05-29 DIAGNOSIS — J069 Acute upper respiratory infection, unspecified: Secondary | ICD-10-CM | POA: Diagnosis not present

## 2015-09-10 ENCOUNTER — Telehealth: Payer: 59 | Admitting: Nurse Practitioner

## 2015-09-10 DIAGNOSIS — N3000 Acute cystitis without hematuria: Secondary | ICD-10-CM | POA: Diagnosis not present

## 2015-09-10 MED ORDER — SULFAMETHOXAZOLE-TRIMETHOPRIM 800-160 MG PO TABS: 1.0000 | ORAL_TABLET | Freq: Two times a day (BID) | ORAL | Status: DC

## 2015-09-10 NOTE — Progress Notes (Signed)

## 2016-01-10 MED FILL — PHENTERMINE 37.5 MG TABLET: 37.5 | 30 days supply | Qty: 30 | Fill #0

## 2016-01-14 ENCOUNTER — Ambulatory Visit (HOSPITAL_COMMUNITY)
Admission: EM | Admit: 2016-01-14 | Discharge: 2016-01-14 | Disposition: A | Discharge status: Home / Self Care | Payer: 59 | Attending: Emergency Medicine | Admitting: Emergency Medicine

## 2016-01-14 ENCOUNTER — Encounter (HOSPITAL_COMMUNITY): Payer: Self-pay | Admitting: Emergency Medicine

## 2016-01-14 DIAGNOSIS — J069 Acute upper respiratory infection, unspecified: Secondary | ICD-10-CM | POA: Diagnosis not present

## 2016-01-14 DIAGNOSIS — R0981 Nasal congestion: Secondary | ICD-10-CM

## 2016-01-14 NOTE — Discharge Instructions (Signed)
Sudafed PE 10 mg every 4 to 6 hours as needed for congestion °Allegra or Zyrtec daily as needed for drainage and runny nose. °For stronger antihistamine may take Chlor-Trimeton 2 to 4 mg every 4 to 6 hours, may cause drowsiness. °Saline nasal spray used frequently. °Ibuprofen 600 mg every 6 hours as needed for pain, discomfort or fever. °Drink plenty of fluids and stay well-hydrated. °Flonase or Rhinocort nasal spray daily °

## 2016-01-14 NOTE — ED Provider Notes (Signed)
CSN: [id number]     Arrival date & time 01/14/16  1205 History   First MD Initiated Contact with Patient 01/14/16 1331     Chief Complaint  Patient presents with  . Nasal Congestion   (Consider location/radiation/quality/duration/timing/severity/associated sxs/prior Treatment) 35 year old female who brings her 76-year-old child to be checked for fever and what appears to be URI type symptoms. The 35 year old female had a virus 2 weeks ago and more recently she had a sore throat. Some of those "file symptoms" had abated. 2 days ago she developed various areas of achiness and a headache for 3 days. Headache is primarily in the back of the head along the temples. Denies any fever. Denies GI symptoms. Denies problems vision, speech, hearing, swallowing. She remains busy taking care of her very energetic 75-year-old.      Past Medical History:  Diagnosis Date  . Abnormal quad screen   . Obesity   . Vaginal Pap smear, abnormal    Past Surgical History:  Procedure Laterality Date  . COLPOSCOPY     Family History  Problem Relation Age of Onset  . Hearing loss Maternal Grandmother   . Diabetes Maternal Grandmother    Social History  Substance Use Topics  . Smoking status: Never Smoker  . Smokeless tobacco: Never Used  . Alcohol use Yes   OB History    Gravida Para Term Preterm AB Living   3 3 3     3    SAB TAB Ectopic Multiple Live Births           3     Review of Systems  Constitutional: Positive for activity change. Negative for appetite change, chills, fatigue and fever.  HENT: Positive for congestion, postnasal drip and rhinorrhea. Negative for facial swelling, sore throat and trouble swallowing.   Eyes: Negative.   Respiratory: Positive for cough. Negative for shortness of breath.   Cardiovascular: Negative.   Genitourinary: Negative.   Musculoskeletal: Negative for neck pain and neck stiffness.  Skin: Negative for pallor and rash.  Neurological: Negative.   All other  systems reviewed and are negative.   Allergies  Patient has no known allergies.  Home Medications   Prior to Admission medications   Medication Sig Start Date End Date Taking? Authorizing Provider  Cholecalciferol (VITAMIN D PO) Take 1 tablet by mouth daily.    Historical Provider, MD  ibuprofen (ADVIL,MOTRIN) 600 MG tablet Take 1 tablet (600 mg total) by mouth every 6 (six) hours. 09/19/13   Laury Deep, CNM  oxyCODONE-acetaminophen (PERCOCET/ROXICET) 5-325 MG per tablet Take 1-2 tablets by mouth every 4 (four) hours as needed for severe pain (moderate - severe pain). 09/19/13   Laury Deep, CNM  Prenatal Vit-Fe Fumarate-FA (PRENATAL MULTIVITAMIN) TABS tablet Take 1 tablet by mouth daily at 12 noon.    Historical Provider, MD  sulfamethoxazole-trimethoprim (BACTRIM DS) 800-160 MG tablet Take 1 tablet by mouth 2 (two) times daily. 09/10/15   Mary-Margaret Hassell Done, FNP   Meds Ordered and Administered this Visit  Medications - No data to display  BP 117/79 (BP Location: Right Arm)   Pulse 88   Temp 99 F (37.2 C) (Oral)   Resp 14   LMP 12/31/2015   SpO2 97%  No data found.   Physical Exam  Constitutional: She is oriented to person, place, and time. She appears well-developed and well-nourished. No distress.  HENT:  Head: Normocephalic and atraumatic.  Right Ear: External ear normal.  Left Ear: External ear normal.  Mouth/Throat: No  oropharyngeal exudate.  Bilateral TMs are normal. Oropharynx with cobblestoning and light clear PND. No exudates.  Eyes: EOM are normal.  Neck: Normal range of motion. Neck supple.  Cardiovascular: Normal rate, regular rhythm and normal heart sounds.   Pulmonary/Chest: Effort normal and breath sounds normal. No respiratory distress.  Musculoskeletal: Normal range of motion. She exhibits no edema.  Lymphadenopathy:    She has no cervical adenopathy.  Neurological: She is alert and oriented to person, place, and time.  Skin: Skin is warm and dry.   Psychiatric: She has a normal mood and affect.  Nursing note and vitals reviewed.   Urgent Care Course   Clinical Course     Procedures (including critical care time)  Labs Review Labs Reviewed - No data to display  Imaging Review No results found.   Visual Acuity Review  Right Eye Distance:   Left Eye Distance:   Bilateral Distance:    Right Eye Near:   Left Eye Near:    Bilateral Near:         MDM   1. URI, acute   2. Nasal congestion    Sudafed PE 10 mg every 4 to 6 hours as needed for congestion Allegra or Zyrtec daily as needed for drainage and runny nose. For stronger antihistamine may take Chlor-Trimeton 2 to 4 mg every 4 to 6 hours, may cause drowsiness. Saline nasal spray used frequently. Ibuprofen 600 mg every 6 hours as needed for pain, discomfort or fever. Drink plenty of fluids and stay well-hydrated. Flonase or Rhinocort nasal spray daily    Janne Napoleon, NP 01/14/16 1336

## 2016-01-14 NOTE — ED Triage Notes (Signed)
Patient presents to [facility] Levine today with complaint of headache, congestion and patient states that her daughter was diagnosed with strep on Monday of this past week. Patient reports that she has some nausea.

## 2016-02-08 MED FILL — PHENTERMINE 37.5 MG TABLET: 37.5 | 30 days supply | Qty: 30 | Fill #0

## 2016-02-27 DIAGNOSIS — B001 Herpesviral vesicular dermatitis: Secondary | ICD-10-CM | POA: Diagnosis not present

## 2016-02-27 DIAGNOSIS — Z6832 Body mass index (BMI) 32.0-32.9, adult: Secondary | ICD-10-CM | POA: Diagnosis not present

## 2016-02-27 DIAGNOSIS — L040 Acute lymphadenitis of face, head and neck: Secondary | ICD-10-CM | POA: Diagnosis not present

## 2016-03-15 MED FILL — PHENTERMINE 37.5 MG TABLET: 37.5 | 30 days supply | Qty: 30 | Fill #0

## 2016-03-15 MED FILL — POLYETHYLENE GLYCOL 3350 PO: 30 days supply | Qty: 527 | Fill #0

## 2016-03-19 DIAGNOSIS — J028 Acute pharyngitis due to other specified organisms: Secondary | ICD-10-CM | POA: Diagnosis not present

## 2016-03-19 DIAGNOSIS — Z6831 Body mass index (BMI) 31.0-31.9, adult: Secondary | ICD-10-CM | POA: Diagnosis not present

## 2016-03-26 ENCOUNTER — Ambulatory Visit (INDEPENDENT_AMBULATORY_CARE_PROVIDER_SITE_OTHER): Payer: 59 | Admitting: Allergy & Immunology

## 2016-03-26 ENCOUNTER — Encounter: Payer: Self-pay | Admitting: Allergy & Immunology

## 2016-03-26 VITALS — BP 118/68 | HR 86 | Temp 97.8°F | Resp 16 | Ht 63.78 in | Wt 191.2 lb

## 2016-03-26 DIAGNOSIS — T781XXD Other adverse food reactions, not elsewhere classified, subsequent encounter: Secondary | ICD-10-CM

## 2016-03-26 DIAGNOSIS — J3089 Other allergic rhinitis: Secondary | ICD-10-CM

## 2016-03-26 DIAGNOSIS — K219 Gastro-esophageal reflux disease without esophagitis: Secondary | ICD-10-CM | POA: Insufficient documentation

## 2016-03-26 NOTE — Patient Instructions (Addendum)
1. Chronic allergic rhinitis - Testing today showed: mild reactivity to grasses, weeds, ragweed, molds, and dust mite - Avoidance measures discussed.  - Start Qnasl 18mcg two sprays per nostril once daily. - Change to Xyzal 5mg  daily. - Continue with nasal saline rinses 1-2 times daily. - Consider the addition of allergy shots as a means of long-term control.   2. Gastroesophageal reflux disease - Sample of Dexilant 30mg  one tablet daily provided. - Please call us if this seems to help and we can send in a prescription.  3. Return in about 3 months (around 06/24/2016).  Please inform us of any Emergency Department visits, hospitalizations, or changes in symptoms. Call us before going to the ED for breathing or allergy symptoms since we might be able to fit you in for a sick visit. Feel free to contact us anytime with any questions, problems, or concerns.  It was a pleasure to see you and your family again today! Best wishes in the [place] Year!   Websites that have reliable patient information: 1. American Academy of Asthma, Allergy, and Immunology: www.aaaai.org 2. Food Allergy Research and Education (FARE): foodallergy.org 3. Mothers of Asthmatics: http://www.asthmacommunitynetwork.org 4. American College of Allergy, Asthma, and Immunology: www.acaai.org  Control of House Dust Mite Allergen    House dust mites play a major role in allergic asthma and rhinitis.  They occur in environments with high humidity wherever human skin, the food for dust mites is found. High levels have been detected in dust obtained from mattresses, pillows, carpets, upholstered furniture, bed covers, clothes and soft toys.  The principal allergen of the house dust mite is found in its feces.  A gram of dust may contain 1,000 mites and 250,000 fecal particles.  Mite antigen is easily measured in the air during house cleaning activities.    1. Encase mattresses, including the box spring, and pillow, in an air tight  cover.  Seal the zipper end of the encased mattresses with wide adhesive tape. 2. Wash the bedding in water of 130 degrees Farenheit weekly.  Avoid cotton comforters/quilts and flannel bedding: the most ideal bed covering is the dacron comforter. 3. Remove all upholstered furniture from the bedroom. 4. Remove carpets, carpet padding, rugs, and non-washable window drapes from the bedroom.  Wash drapes weekly or use plastic window coverings. 5. Remove all non-washable stuffed toys from the bedroom.  Wash stuffed toys weekly. 6. Have the room cleaned frequently with a vacuum cleaner and a damp dust-mop.  The patient should not be in a room which is being cleaned and should wait 1 hour after cleaning before going into the room. 7. Close and seal all heating outlets in the bedroom.  Otherwise, the room will become filled with dust-laden air.  An electric heater can be used to heat the room. 8. Reduce indoor humidity to less than 50%.  Do not use a humidifier.  Reducing Pollen Exposure  The American Academy of Allergy, Asthma and Immunology suggests the following steps to reduce your exposure to pollen during allergy seasons.    1. Do not hang sheets or clothing out to dry; pollen may collect on these items. 2. Do not mow lawns or spend time around freshly cut grass; mowing stirs up pollen. 3. Keep windows closed at night.  Keep car windows closed while driving. 4. Minimize morning activities outdoors, a time when pollen counts are usually at their highest. 5. Stay indoors as much as possible when pollen counts or humidity is high and  on windy days when pollen tends to remain in the air longer. 6. Use air conditioning when possible.  Many air conditioners have filters that trap the pollen spores. 7. Use a HEPA room air filter to remove pollen form the indoor air you breathe.  Control of Mold Allergen  Mold and fungi can grow on a variety of surfaces provided certain temperature and moisture  conditions exist.  Outdoor molds grow on plants, decaying vegetation and soil.  The major outdoor mold, Alternaria and Cladosporium, are found in very high numbers during hot and dry conditions.  Generally, a late Summer - Fall peak is seen for common outdoor fungal spores.  Rain will temporarily lower outdoor mold spore count, but counts rise rapidly when the rainy period ends.  The most important indoor molds are Aspergillus and Penicillium.  Dark, humid and poorly ventilated basements are ideal sites for mold growth.  The next most common sites of mold growth are the bathroom and the kitchen.  Outdoor [employer] 1. Use air conditioning and keep windows closed 2. Avoid exposure to decaying vegetation. 3. Avoid leaf raking. 4. Avoid grain handling. 5. Consider wearing a face mask if working in moldy areas.  Indoor Mold Control 1. Maintain humidity below 50%. 2. Clean washable surfaces with 5% bleach solution. 3. Remove sources e.g. contaminated carpets.

## 2016-03-26 NOTE — Progress Notes (Signed)
NEW PATIENT  Date of Service/Encounter:  03/26/16  Referring provider: [place]   Assessment:   Chronic nonseasonal allergic rhinitis due to fungal spores - Plan: Allergy Test, Interdermal Allergy Test  Gastroesophageal reflux disease, esophagitis presence not specified  Adverse food reaction, subsequent encounter   Plan/Recommendations:   1. Chronic allergic rhinitis - Testing today showed: mild reactivity to grasses, weeds, ragweed, molds, and dust mite - Avoidance measures discussed.  - Start Qnasl 53mcg two sprays per nostril once daily. - Change to Xyzal 5mg  daily. - Continue with nasal saline rinses 1-2 times daily.  - Consider the addition of allergy shots as a means of long-term control.   - Samples provided. - I did ask Ms. Mclean to call us if she likes these medications and we can send in prescriptions for these. - Allergen immunotherapy discussed but we would like to try medical management instead initially.   2. Adverse food reaction - Testing to the most common foods was negative. - Ms. Alaimo will take a dietary history to come up with a list of triggering foods for future testing. - Symptoms never go beyond rhinorrhea, therefore we can hold off on an EpiPen at this time.   3. Gastroesophageal reflux disease - Sample of Dexilant 30mg  one tablet daily provided. - Please call us if this seems to help and we can send in a prescription.  4. Return in about 3 months (around 06/24/2016).   Subjective:   EMANI ISOLA is a 36 y.o. female presenting today for evaluation of  Chief Complaint  Patient presents with  . New Evaluation    Nasal congestion all year worse in spring and summer    ZAYLYNN ASMAR has a history of the following: Patient Active Problem List   Diagnosis Date Noted  . Chronic nonseasonal allergic rhinitis due to fungal spores 03/26/2016  . Gastroesophageal reflux disease 03/26/2016    History obtained from: chart  review and patient.  Jerrye Noble was referred by [place].     Brenlee is a 36 y.o. female presenting for an allergy evaluation due to chronic nasal drainage. Symptoms occur throughout that year and it worsens during the spring and summer. She is using nasal saline rinses without improvement. Morning seems to be the worst time of the day. She has had tonsil stones. She also endorses halitosis. She has been having headaches as well. She has had breakthrough sinus infections four times this past 12 months. She will often need steroid injections. She did have an episode last week with ear pain and ear itching in conjunction with sore throat that improved on its own.   Ms. Tondreau has tried taking cetirizine but has had no improvement with this. She has never tried a nasal spray at all. She has never been allergy tested.   Ms. Saley does have increased drainage with ingestion of certain foods. She has this wen she goes out to eat. It has nothing to do with spicy foods since they eat spicy foods at home without a problem. She is unable to pin point the exact food. Symptoms last around a couple of hours. She has never had systemic symptoms to these reactions.    She does have a history of recent reflux symptoms. These have been ongoing for the past 1-2 weeks. Her last episode started on Saturday and continued through yesterday. She has tried treating with medications, although she is unsure of the names. She did have reflux  when she was pregnant with all 3 children, but otherwise has no history of reflux.   Otherwise, there is no history of other atopic diseases, including asthma, drug allergies, stinging insect allergies, or urticaria. There is no significant infectious history. Vaccinations are up to date.    Past Medical History: Patient Active Problem List   Diagnosis Date Noted  . Chronic nonseasonal allergic rhinitis due to fungal spores 03/26/2016  . Gastroesophageal reflux  disease 03/26/2016    Medication List:  Allergies as of 03/26/2016   No Known Allergies     Medication List       Accurate as of 03/26/16  4:31 PM. Always use your most recent med list.          phentermine 37.5 MG tablet Commonly known as:  ADIPEX-P Take 37.5 mg by mouth daily.       Birth History: non-contributory.    Developmental History: non-contributory.    Past Surgical History: Past Surgical History:  Procedure Laterality Date  . COLPOSCOPY       Family History: Family History  Problem Relation Age of Onset  . Hearing loss Maternal Grandmother   . Diabetes Maternal Grandmother   . Asthma Daughter   . Eczema Daughter   . Allergic rhinitis Neg Hx   . Angioedema Neg Hx   . Immunodeficiency Neg Hx   . Urticaria Neg Hx      Social History: Eris lives at home with her husband and three children. They live in a 14 year old home. There is one throughout the home. They have electric heating and central cooling. There is one [place] pig in the home. There are no dust mite covers on the bedding. There is no tobacco exposure. 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    Hopkins: a 14-point review of systems is pertinent for what is mentioned in HPI.  Otherwise, all other systems were negative. Constitutional: negative other than that listed in the HPI Eyes: negative other than that listed in the HPI Ears, nose, mouth, throat, and face: negative other than that listed in the HPI Respiratory: negative other than that listed in the HPI Cardiovascular: negative other than that listed in the HPI Gastrointestinal: negative other than that listed in the HPI Genitourinary: negative other than that listed in the HPI Integument: negative other than that listed in the HPI Hematologic: negative other than that listed in the HPI Musculoskeletal: negative other than that listed in the HPI Neurological: negative other than that  listed in the HPI Allergy/Immunologic: negative other than that listed in the HPI    Objective:   Blood pressure 118/68, pulse 86, temperature 97.8 F (36.6 C), temperature source Oral, resp. rate 16, height 5' 3.78" (1.62 m), weight 191 lb 3.2 oz (86.7 kg), SpO2 97 % Body mass index is 33.05 kg/m.   Physical Exam:  General: Alert, interactive, in no acute distress. Pleasant. Good sense of humor.  Eyes: No conjunctival injection present on the right, No conjunctival injection present on the left, PERRL bilaterally, No discharge on the right, No discharge on the left and No Horner-Trantas dots present Ears: Right TM pearly gray with normal light reflex, Left TM pearly gray with normal light reflex, Right TM intact without perforation and Left TM intact without perforation.  Nose/Throat: External nose within normal limits, nasal crease present and septum midline, turbinates markedly edematous and pale with clear discharge, post-pharynx mildly erythematous with cobblestoning in the posterior oropharynx.  Tonsils 2+ without exudates Neck: Supple without thyromegaly. Adenopathy: no enlarged lymph nodes appreciated in the anterior cervical, occipital, axillary, epitrochlear, inguinal, or popliteal regions Lungs: Clear to auscultation without wheezing, rhonchi or rales. No increased work of breathing. CV: Normal S1/S2, no murmurs. Capillary refill <2 seconds.  Abdomen: Nondistended, nontender. No guarding or rebound tenderness. Bowel sounds present in all fields and hypoactive  Skin: Warm and dry, without lesions or rashes. Extremities:  No clubbing, cyanosis or edema. Neuro:   Grossly intact. No focal deficits appreciated. Responsive to questions.  Diagnostic studies:   Allergy Studies:   Indoor/Outdoor Percutaneous Adult Environmental Panel: negative to entire panel with adequate controls.  Indoor/Outdoor Selected Intradermal Environmental Panel: positive to [employer], Grass mix,  ragweed mix, weed mix, mold mix #1, mold mix #2, mold mix #4 and mite mix. Otherwise negative with adequate controls.  Most Common Foods Panel (peanut, tree nut, soy, fish mix, shellfish mix, wheat, milk, egg): negative to all with adequate controls      Salvatore Marvel, MD [facility]

## 2016-04-10 DIAGNOSIS — L309 Dermatitis, unspecified: Secondary | ICD-10-CM | POA: Diagnosis not present

## 2016-04-10 DIAGNOSIS — D229 Melanocytic nevi, unspecified: Secondary | ICD-10-CM | POA: Diagnosis not present

## 2016-05-08 MED FILL — TRIAMCINOLONE 0.1% CREAM: 0.1 | 15 days supply | Qty: 45 | Fill #0

## 2016-05-08 MED FILL — PHENTERMINE 37.5 MG TABLET: 37.5 | 30 days supply | Qty: 30 | Fill #0

## 2016-06-18 MED FILL — PHENTERMINE 37.5 MG TABLET: 37.5 | 30 days supply | Qty: 30 | Fill #0

## 2016-07-02 ENCOUNTER — Encounter: Payer: Self-pay | Admitting: Allergy & Immunology

## 2016-07-02 ENCOUNTER — Ambulatory Visit (INDEPENDENT_AMBULATORY_CARE_PROVIDER_SITE_OTHER): Payer: 59 | Admitting: Allergy & Immunology

## 2016-07-02 VITALS — BP 100/70 | HR 87 | Temp 98.2°F | Resp 16

## 2016-07-02 DIAGNOSIS — J3089 Other allergic rhinitis: Secondary | ICD-10-CM | POA: Diagnosis not present

## 2016-07-02 DIAGNOSIS — K219 Gastro-esophageal reflux disease without esophagitis: Secondary | ICD-10-CM

## 2016-07-02 MED ORDER — LEVOCETIRIZINE DIHYDROCHLORIDE 5 MG PO TABS: 5.0000 mg | ORAL_TABLET | Freq: Every evening | ORAL | 5 refills | Status: DC

## 2016-07-02 MED ORDER — BECLOMETHASONE DIPROPIONATE 80 MCG/ACT NA AERS: 2.0000 | INHALATION_SPRAY | Freq: Every day | NASAL | 5 refills | Status: DC

## 2016-07-02 NOTE — Patient Instructions (Addendum)
1. Chronic allergic rhinitis (grasses, weeds, ragweed, molds, dust mite) - Continue with Qnasl 34mcg, but decrease to two sprays per nostril every other day or so to decrease the burning.  - Continue with Xyzal 5mg  daily. - Continue with nasal saline rinses 1-2 times daily. - Consider the addition of allergy shots as a means of long-term control.   2. Gastroesophageal reflux disease - Continue with Dexilant 30mg  one tablet daily. - Please call us if this seems to help and we can send in a prescription.  3. Return in about 6 months (around 01/02/2017).  Please inform us of any Emergency Department visits, hospitalizations, or changes in symptoms. Call us before going to the ED for breathing or allergy symptoms since we might be able to fit you in for a sick visit. Feel free to contact us anytime with any questions, problems, or concerns.  It was a pleasure to see you and your family again today! Happy spring!   Websites that have reliable patient information: 1. American Academy of Asthma, Allergy, and Immunology: www.aaaai.org 2. Food Allergy Research and Education (FARE): foodallergy.org 3. Mothers of Asthmatics: http://www.asthmacommunitynetwork.org 4. American College of Allergy, Asthma, and Immunology: www.acaai.org

## 2016-07-02 NOTE — Progress Notes (Signed)
FOLLOW UP  Date of Service/Encounter:  07/02/16   Assessment:   Chronic nonseasonal allergic rhinitis (grasses, weeds, ragweed, molds, dust mite)  Gastroesophageal reflux disease - stable on Dexilant 30mg  daily   Plan/Recommendations:   1. Chronic allergic rhinitis (grasses, weeds, ragweed, molds, dust mite) - Continue with Qnasl 73mcg, but decrease to two sprays per nostril every other day or so to decrease the burning.  - Continue with Xyzal 5mg  daily. - Continue with nasal saline rinses 1-2 times daily. - Consider the addition of allergy shots as a means of long-term control.   2. Gastroesophageal reflux disease - Continue with Dexilant 30mg  one tablet daily. - Please call us if this seems to help and we can send in a prescription.  3. Return in about 6 months (around 01/02/2017).     Subjective:   Jessica Levine is a 36 y.o. female presenting today for follow up of  Chief Complaint  Patient presents with  . Allergic Rhinitis     Jessica Levine has a history of the following: Patient Active Problem List   Diagnosis Date Noted  . Chronic nonseasonal allergic rhinitis due to fungal spores 03/26/2016  . Gastroesophageal reflux disease 03/26/2016    History obtained from: chart review and patient.  Jessica Levine was referred by Jessica Percy, MD.     Eilidh is a 36 y.o. female presenting for a follow up visit. She was last seen in January 2018 as a new patient. At that time, she had testing that was positive to grasses, weeds, ragweed, molds, and dust mite. She was started on Qnasl 80 g 2 sprays per nostril daily. We changed her to Xyzal 5 mg daily and encouraged her to use nasal saline rinses. He did briefly discuss allergy shots as a means of long-term control. We did do testing to the most common foods which were negative. However, her symptoms only included rhinorrhea therefore we did not send in an EpiPen. We gave her a sample of Dexilant to use for her  gastroesophageal reflux disease.  Since last visit, she has done very well. She has her daughters with her today because they recently went to the [employer] for a school field trip. They seem to have had a great time since they are pouring over pictures. She used the Qnasl regularly for a short period of time, but experienced burning. Therefore she stopped. She is using nasal saline rinses. She did use the Xyzal, but ran out of the sample and never picked any up. She does remain on nasal saline rinses. She is not interested in allergy shots. Her daughter is currently receiving no, and she does not think that she wants to commit the time to building up to her maintenance dose. With the medications, she was actually feeling fairly good the spring. She seems surprised when I ask about reflux because she has not noticed until now that the [place] has literally made her forget about the reflux completely.   Otherwise, there have been no changes to her past medical history, surgical history, family history, or social history.    Review of Systems: a 14-point review of systems is pertinent for what is mentioned in HPI.  Otherwise, all other systems were negative. Constitutional: negative other than that listed in the HPI Eyes: negative other than that listed in the HPI Ears, nose, mouth, throat, and face: negative other than that listed in the HPI Respiratory: negative other than that listed in the  HPI Cardiovascular: negative other than that listed in the HPI Gastrointestinal: negative other than that listed in the HPI Genitourinary: negative other than that listed in the HPI Integument: negative other than that listed in the HPI Hematologic: negative other than that listed in the HPI Musculoskeletal: negative other than that listed in the HPI Neurological: negative other than that listed in the HPI Allergy/Immunologic: negative other than that listed in the HPI    Objective:   Blood  pressure 100/70, pulse 87, temperature 98.2 F (36.8 C), temperature source Oral, resp. rate 16, SpO2 97 %, unknown if currently breastfeeding. There is no height or weight on file to calculate BMI.   Physical Exam:  General: Alert, interactive, in no acute distress. Pleasant.  Eyes: No conjunctival injection present on the right, No conjunctival injection present on the left, PERRL bilaterally, No discharge on the right, No discharge on the left and No Horner-Trantas dots present Ears: Left TM pearly gray with normal light reflex, Left TM intact without perforation and Right TM unable to be visualized due to cerumen impaction.  Nose/Throat: External nose within normal limits, nasal crease present and septum midline, turbinates markedly edematous with clear discharge, post-pharynx erythematous with cobblestoning in the posterior oropharynx. Tonsils 2+ without exudates Neck: Supple without thyromegaly. Lungs: Clear to auscultation without wheezing, rhonchi or rales. No increased work of breathing. CV: Normal S1/S2, no murmurs. Capillary refill <2 seconds.  Skin: Warm and dry, without lesions or rashes. Neuro:   Grossly intact. No focal deficits appreciated. Responsive to questions.   Diagnostic studies: none    Salvatore Marvel, MD [facility]

## 2016-07-09 ENCOUNTER — Telehealth: Payer: Self-pay

## 2016-07-09 MED ORDER — FLUTICASONE PROPIONATE 50 MCG/ACT NA SUSP: 1.0000 | Freq: Every day | NASAL | 5 refills | Status: DC

## 2016-07-09 MED FILL — FLUTICASONE PROP 50 MCG SPR: 50 | 60 days supply | Qty: 16 | Fill #0

## 2016-07-09 NOTE — Telephone Encounter (Signed)
Received a fax that the pt needed to try Flonase before she can get Qnasl approved. Per Dr. Ernst Bowler, Flonase is being sent to pharmacy.

## 2016-07-18 DIAGNOSIS — Z01419 Encounter for gynecological examination (general) (routine) without abnormal findings: Secondary | ICD-10-CM | POA: Diagnosis not present

## 2016-07-18 DIAGNOSIS — Z6831 Body mass index (BMI) 31.0-31.9, adult: Secondary | ICD-10-CM | POA: Diagnosis not present

## 2016-11-18 DIAGNOSIS — Z6829 Body mass index (BMI) 29.0-29.9, adult: Secondary | ICD-10-CM | POA: Diagnosis not present

## 2016-11-18 DIAGNOSIS — J019 Acute sinusitis, unspecified: Secondary | ICD-10-CM | POA: Diagnosis not present

## 2017-01-01 DIAGNOSIS — H109 Unspecified conjunctivitis: Secondary | ICD-10-CM | POA: Diagnosis not present

## 2017-01-01 DIAGNOSIS — Z6830 Body mass index (BMI) 30.0-30.9, adult: Secondary | ICD-10-CM | POA: Diagnosis not present

## 2017-01-07 ENCOUNTER — Ambulatory Visit: Payer: 59 | Admitting: Allergy & Immunology

## 2017-01-13 DIAGNOSIS — Z130 Encounter for screening for diseases of the blood and blood-forming organs and certain disorders involving the immune mechanism: Secondary | ICD-10-CM | POA: Diagnosis not present

## 2017-01-13 DIAGNOSIS — Z0000 Encounter for general adult medical examination without abnormal findings: Secondary | ICD-10-CM | POA: Diagnosis not present

## 2017-01-13 DIAGNOSIS — Z13220 Encounter for screening for lipoid disorders: Secondary | ICD-10-CM | POA: Diagnosis not present

## 2017-01-13 DIAGNOSIS — Z1329 Encounter for screening for other suspected endocrine disorder: Secondary | ICD-10-CM | POA: Diagnosis not present

## 2017-03-11 ENCOUNTER — Ambulatory Visit: Payer: No Typology Code available for payment source | Admitting: Allergy & Immunology

## 2017-03-11 DIAGNOSIS — J309 Allergic rhinitis, unspecified: Secondary | ICD-10-CM

## 2017-10-21 ENCOUNTER — Other Ambulatory Visit (HOSPITAL_COMMUNITY): Payer: Self-pay | Admitting: Obstetrics & Gynecology

## 2017-10-22 ENCOUNTER — Other Ambulatory Visit (HOSPITAL_COMMUNITY): Payer: Self-pay | Admitting: Obstetrics & Gynecology

## 2017-10-22 DIAGNOSIS — N644 Mastodynia: Secondary | ICD-10-CM

## 2017-10-28 ENCOUNTER — Encounter (HOSPITAL_COMMUNITY): Payer: Self-pay

## 2017-10-28 ENCOUNTER — Ambulatory Visit (HOSPITAL_COMMUNITY)
Admission: RE | Admit: 2017-10-28 | Discharge: 2017-10-28 | Disposition: A | Discharge status: Home / Self Care | Payer: No Typology Code available for payment source | Source: Ambulatory Visit | Attending: Obstetrics & Gynecology | Admitting: Obstetrics & Gynecology

## 2017-10-28 DIAGNOSIS — N644 Mastodynia: Secondary | ICD-10-CM | POA: Insufficient documentation

## 2018-05-04 ENCOUNTER — Telehealth: Payer: No Typology Code available for payment source | Admitting: Family

## 2018-05-04 DIAGNOSIS — J029 Acute pharyngitis, unspecified: Secondary | ICD-10-CM | POA: Diagnosis not present

## 2018-05-04 MED ORDER — AMOXICILLIN 875 MG PO TABS: 875.0000 mg | ORAL_TABLET | Freq: Two times a day (BID) | ORAL | 0 refills | Status: DC

## 2018-05-04 NOTE — Progress Notes (Signed)
We are sorry that you are not feeling well.  Here is how we plan to help!  Based on what you have shared with me it is likely that you have strep pharyngitis.  Strep pharyngitis is inflammation and infection in the back of the throat.  This is an infection cause by bacteria and is treated with antibiotics.  I have prescribed Amoxicillin 875 mg one tablet twice daily for 7 days. For throat pain, we recommend over the counter oral pain relief medications such as acetaminophen or aspirin, or anti-inflammatory medications such as ibuprofen or naproxen sodium. Topical treatments such as oral throat lozenges or sprays may be used as needed. Strep infections are not as easily transmitted as other respiratory infections, however we still recommend that you avoid close contact with loved ones, especially the very young and elderly.  Remember to wash your hands thoroughly throughout the day as this is the number one way to prevent the spread of infection and wipe down door knobs and counters with disinfectant.  Approximately 5 minutes spent reviewing and documenting in patient's chart.    Home Care:  Only take medications as instructed by your medical team.  Complete the entire course of an antibiotic.  Do not take these medications with alcohol.  A steam or ultrasonic humidifier can help congestion.  You can place a towel over your head and breathe in the steam from hot water coming from a faucet.  Avoid close contacts especially the very young and the elderly.  Cover your mouth when you cough or sneeze.  Always remember to wash your hands.  Get Help Right Away If:  You develop worsening fever or sinus pain.  You develop a severe head ache or visual changes.  Your symptoms persist after you have completed your treatment plan.  Make sure you  Understand these instructions.  Will watch your condition.  Will get help right away if you are not doing well or get worse.  Your e-visit answers  were reviewed by a board certified advanced clinical practitioner to complete your personal care plan.  Depending on the condition, your plan could have included both over the counter or prescription medications.  If there is a problem please reply  once you have received a response from your provider.  Your safety is important to [place].  If you have drug allergies check your prescription carefully.    You can use MyChart to ask questions about today's visit, request a non-urgent call back, or ask for a work or school excuse for 24 hours related to this e-Visit. If it has been greater than 24 hours you will need to follow up with your provider, or enter a new e-Visit to address those concerns.  You will get an e-mail in the next two days asking about your experience.  I hope that your e-visit has been valuable and will speed your recovery. Thank you for using e-visits.

## 2018-05-20 ENCOUNTER — Telehealth: Payer: No Typology Code available for payment source | Admitting: Family

## 2018-05-20 DIAGNOSIS — B373 Candidiasis of vulva and vagina: Secondary | ICD-10-CM

## 2018-05-20 DIAGNOSIS — B3731 Acute candidiasis of vulva and vagina: Secondary | ICD-10-CM

## 2018-05-20 MED ORDER — FLUCONAZOLE 150 MG PO TABS: 150.0000 mg | ORAL_TABLET | ORAL | 0 refills | Status: DC | PRN

## 2018-05-20 NOTE — Progress Notes (Signed)
We are sorry that you are not feeling well. Here is how we plan to help! Based on what you shared with me it looks like you: May have a yeast vaginosis.  If this belly pain gets worse, you will need to be seen face to face to be evaluated. You usually do not get belly pain with yeast infection.   Approximately 5 minutes was spent documenting and reviewing patient's chart.   Vaginosis is an inflammation of the vagina that can result in discharge, itching and pain. The cause is usually a change in the normal balance of vaginal bacteria or an infection. Vaginosis can also result from reduced estrogen levels after menopause.  The most common causes of vaginosis are:   Bacterial vaginosis which results from an overgrowth of one on several organisms that are normally present in your vagina.   Yeast infections which are caused by a naturally occurring fungus called candida.   Vaginal atrophy (atrophic vaginosis) which results from the thinning of the vagina from reduced estrogen levels after menopause.   Trichomoniasis which is caused by a parasite and is commonly transmitted by sexual intercourse.  Factors that increase your risk of developing vaginosis include: Marland Kitchen Medications, such as antibiotics and steroids . Uncontrolled diabetes . Use of hygiene products such as bubble bath, vaginal spray or vaginal deodorant . Douching . Wearing damp or tight-fitting clothing . Using an intrauterine device (IUD) for birth control . Hormonal changes, such as those associated with pregnancy, birth control pills or menopause . Sexual activity . Having a sexually transmitted infection  Your treatment plan is Monistat (miconazole) or Gyne-Lotrimin (clotrimazole) over the counter at most phamacies.  A single Diflucan (fluconazole) 150mg  tablet once.  I have electronically sent this prescription into the pharmacy that you have chosen.  Be sure to take all of the medication as directed. Stop taking any  medication if you develop a rash, tongue swelling or shortness of breath. Mothers who are breast feeding should consider pumping and discarding their breast milk while on these antibiotics. However, there is no consensus that infant exposure at these doses would be harmful.  Remember that medication creams can weaken latex condoms. Marland Kitchen   HOME CARE:  Good hygiene may prevent some types of vaginosis from recurring and may relieve some symptoms:  . Avoid baths, hot tubs and whirlpool spas. Rinse soap from your outer genital area after a shower, and dry the area well to prevent irritation. Don't use scented or harsh soaps, such as those with deodorant or antibacterial action. Marland Kitchen Avoid irritants. These include scented tampons and pads. . Wipe from front to back after using the toilet. Doing so avoids spreading fecal bacteria to your vagina.  Other things that may help prevent vaginosis include:  Marland Kitchen Don't douche. Your vagina doesn't require cleansing other than normal bathing. Repetitive douching disrupts the normal organisms that reside in the vagina and can actually increase your risk of vaginal infection. Douching won't clear up a vaginal infection. . Use a latex condom. Both female and female latex condoms may help you avoid infections spread by sexual contact. . Wear cotton underwear. Also wear pantyhose with a cotton crotch. If you feel comfortable without it, skip wearing underwear to bed. Yeast thrives in [employer] Your symptoms should improve in the next day or two.  GET HELP RIGHT AWAY IF:  . You have pain in your lower abdomen ( pelvic area or over your ovaries) . You develop nausea or vomiting . You  develop a fever . Your discharge changes or worsens . You have persistent pain with intercourse . You develop shortness of breath, a rapid pulse, or you faint.  These symptoms could be signs of problems or infections that need to be evaluated by a medical provider now.  MAKE SURE  YOU    Understand these instructions.  Will watch your condition.  Will get help right away if you are not doing well or get worse.  Your e-visit answers were reviewed by a board certified advanced clinical practitioner to complete your personal care plan. Depending upon the condition, your plan could have included both over the counter or prescription medications. Please review your pharmacy choice to make sure that you have choses a pharmacy that is open for you to pick up any needed prescription, Your safety is important to [place]. If you have drug allergies check your prescription carefully.   You can use MyChart to ask questions about today's visit, request a non-urgent call back, or ask for a work or school excuse for 24 hours related to this e-Visit. If it has been greater than 24 hours you will need to follow up with your provider, or enter a new e-Visit to address those concerns. You will get a MyChart message within the next two days asking about your experience. I hope that your e-visit has been valuable and will speed your recovery.

## 2018-05-21 ENCOUNTER — Encounter: Payer: Self-pay | Admitting: *Deleted

## 2018-09-11 ENCOUNTER — Ambulatory Visit: Payer: No Typology Code available for payment source | Admitting: Allergy & Immunology

## 2018-09-23 ENCOUNTER — Ambulatory Visit: Payer: No Typology Code available for payment source | Admitting: Allergy & Immunology

## 2018-12-01 ENCOUNTER — Telehealth: Payer: No Typology Code available for payment source | Admitting: Physician Assistant

## 2018-12-01 DIAGNOSIS — B001 Herpesviral vesicular dermatitis: Secondary | ICD-10-CM

## 2018-12-01 MED ORDER — VALACYCLOVIR HCL 1 G PO TABS: 2000.0000 mg | ORAL_TABLET | Freq: Two times a day (BID) | ORAL | 0 refills | Status: AC

## 2018-12-01 NOTE — Progress Notes (Signed)
We are sorry that you are not feeling well.  Here is how we plan to help!  Based on what you have shared with me it does look like you have a viral infection.    Most cold sores or fever blisters are small fluid filled blisters around the mouth caused by herpes simplex virus.  The most common strain of the virus causing cold sores is herpes simplex virus 1.  It can be spread by skin contact, sharing eating utensils, or even sharing towels.  Cold sores are contagious to other people until dry. (Approximately 5-7 days).  Wash your hands. You can spread the virus to your eyes through handling your contact lenses after touching the lesions.  Most people experience pain at the sight or tingling sensations in their lips that may begin before the ulcers erupt.  Herpes simplex is treatable but not curable.  It may lie dormant for a long time and then reappear due to stress or prolonged sun exposure.  Many patients have success in treating their cold sores with an over the counter topical called Abreva.  You may apply the cream up to 5 times daily (maximum 10 days) until healing occurs.  If you would like to use an oral antiviral medication to speed the healing of your cold sore, I have sent a prescription to your local pharmacy Valacyclovir 2 gm twice daily for 1 day    HOME CARE:   Wash your hands frequently.  Do not pick at or rub the sore.  Don't open the blisters.  Avoid kissing other people during this time.  Avoid sharing drinking glasses, eating utensils, or razors.  Do not handle contact lenses unless you have thoroughly washed your hands with soap and warm water!  Avoid oral sex during this time.  Herpes from sores on your mouth can spread to your partner's genital area.  Avoid contact with anyone who has eczema or a weakened immune system.  Cold sores are often triggered by exposure to intense sunlight, use a lip balm containing a sunscreen (SPF 30 or higher).  GET HELP RIGHT AWAY  IF:   Blisters look infected.  Blisters occur near or in the eye.  Symptoms last longer than 10 days.  Your symptoms become worse.  MAKE SURE YOU:   Understand these instructions.  Will watch your condition.  Will get help right away if you are not doing well or get worse.    Your e-visit answers were reviewed by a board certified advanced clinical practitioner to complete your personal care plan.  Depending upon the condition, your plan could have  Included both over the counter or prescription medications.    Please review your pharmacy choice.  Be sure that the pharmacy you have chosen is open so that you can pick up your prescription now.  If there is a problem you can message your provider in [place] to have the prescription routed to another pharmacy.    Your safety is important to [place].  If you have drug allergies check our prescription carefully.  For the next 24 hours you can use MyChart to ask questions about today's visit, request a non-urgent call back, or ask for a work or school excuse from your e-visit provider.  You will get an email in the next two days asking about your experience.  I hope that your e-visit has been valuable and will speed your recovery.  Approximately 5 minutes was spent documenting and reviewing patient's chart.

## 2019-07-06 ENCOUNTER — Telehealth: Payer: No Typology Code available for payment source | Admitting: Physician Assistant

## 2019-07-06 DIAGNOSIS — B001 Herpesviral vesicular dermatitis: Secondary | ICD-10-CM | POA: Diagnosis not present

## 2019-07-06 MED ORDER — VALACYCLOVIR HCL 1 G PO TABS: 2000.0000 mg | ORAL_TABLET | Freq: Two times a day (BID) | ORAL | 0 refills | Status: AC

## 2019-07-06 NOTE — Progress Notes (Signed)
We are sorry that you are not feeling well.  Here is how we plan to help!  Based on what you have shared with me it does look like you have a viral infection.    Most cold sores or fever blisters are small fluid filled blisters around the mouth caused by herpes simplex virus.  The most common strain of the virus causing cold sores is herpes simplex virus 1.  It can be spread by skin contact, sharing eating utensils, or even sharing towels.  Cold sores are contagious to other people until dry. (Approximately 5-7 days).  Wash your hands. You can spread the virus to your eyes through handling your contact lenses after touching the lesions.  Most people experience pain at the sight or tingling sensations in their lips that may begin before the ulcers erupt.  Herpes simplex is treatable but not curable.  It may lie dormant for a long time and then reappear due to stress or prolonged sun exposure.  Many patients have success in treating their cold sores with an over the counter topical called Abreva.  You may apply the cream up to 5 times daily (maximum 10 days) until healing occurs.  If you would like to use an oral antiviral medication to speed the healing of your cold sore, I have sent a prescription to your local pharmacy Valacyclovir 2 gm take one by mouth twice a day for 1 day    HOME CARE:   Wash your hands frequently.  Do not pick at or rub the sore.  Don't open the blisters.  Avoid kissing other people during this time.  Avoid sharing drinking glasses, eating utensils, or razors.  Do not handle contact lenses unless you have thoroughly washed your hands with soap and warm water!  Avoid oral sex during this time.  Herpes from sores on your mouth can spread to your partner's genital area.  Avoid contact with anyone who has eczema or a weakened immune system.  Cold sores are often triggered by exposure to intense sunlight, use a lip balm containing a sunscreen (SPF 30 or  higher).  GET HELP RIGHT AWAY IF:   Blisters look infected.  Blisters occur near or in the eye.  Symptoms last longer than 10 days.  Your symptoms become worse.  MAKE SURE YOU:   Understand these instructions.  Will watch your condition.  Will get help right away if you are not doing well or get worse.    Your e-visit answers were reviewed by a board certified advanced clinical practitioner to complete your personal care plan.  Depending upon the condition, your plan could have  Included both over the counter or prescription medications.    Please review your pharmacy choice.  Be sure that the pharmacy you have chosen is open so that you can pick up your prescription now.  If there is a problem you can message your provider in MyChart to have the prescription routed to another pharmacy.    Your safety is important to us.  If you have drug allergies check our prescription carefully.  For the next 24 hours you can use MyChart to ask questions about today's visit, request a non-urgent call back, or ask for a work or school excuse from your e-visit provider.  You will get an email in the next two days asking about your experience.  I hope that your e-visit has been valuable and will speed your recovery.   Greater than 5 minutes, yet less   than 10 minutes of time have been spent researching, coordinating and implementing care for this patient today.    

## 2020-01-03 ENCOUNTER — Telehealth: Payer: No Typology Code available for payment source | Admitting: Emergency Medicine

## 2020-01-03 DIAGNOSIS — J329 Chronic sinusitis, unspecified: Secondary | ICD-10-CM

## 2020-01-03 MED ORDER — AMOXICILLIN-POT CLAVULANATE 875-125 MG PO TABS: 1.0000 | ORAL_TABLET | Freq: Two times a day (BID) | ORAL | 0 refills | Status: AC

## 2020-01-03 NOTE — Progress Notes (Signed)
We are sorry that you are not feeling well.  Here is how we plan to help!  Based on what you have shared with me it looks like you have sinusitis.  Sinusitis is inflammation and infection in the sinus cavities of the head.  Based on your presentation I believe you most likely have Acute Bacterial Sinusitis.  This is an infection caused by bacteria and is treated with antibiotics. I have prescribed Augmentin 875mg /125mg  one tablet twice daily with food, for 7 days. You may use an oral decongestant such as Mucinex D or if you have glaucoma or high blood pressure use plain Mucinex. Saline nasal spray help and can safely be used as often as needed for congestion.  If you develop worsening sinus pain, fever or notice severe headache and vision changes, or if symptoms are not better after completion of antibiotic, please schedule an appointment with a health care provider.    Sinus infections are not as easily transmitted as other respiratory infection, however we still recommend that you avoid close contact with loved ones, especially the very young and elderly.  Remember to wash your hands thoroughly throughout the day as this is the number one way to prevent the spread of infection!  Home Care:  Only take medications as instructed by your medical team.  Complete the entire course of an antibiotic.  Do not take these medications with alcohol.  A steam or ultrasonic humidifier can help congestion.  You can place a towel over your head and breathe in the steam from hot water coming from a faucet.  Avoid close contacts especially the very young and the elderly.  Cover your mouth when you cough or sneeze.  Always remember to wash your hands.  Get Help Right Away If:  You develop worsening fever or sinus pain.  You develop a severe head ache or visual changes.  Your symptoms persist after you have completed your treatment plan.  Make sure you  Understand these instructions.  Will watch your  condition.  Will get help right away if you are not doing well or get worse.  Your e-visit answers were reviewed by a board certified advanced clinical practitioner to complete your personal care plan.  Depending on the condition, your plan could have included both over the counter or prescription medications.  If there is a problem please reply  once you have received a response from your provider.  Your safety is important to [place].  If you have drug allergies check your prescription carefully.    You can use MyChart to ask questions about today's visit, request a non-urgent call back, or ask for a work or school excuse for 24 hours related to this e-Visit. If it has been greater than 24 hours you will need to follow up with your provider, or enter a new e-Visit to address those concerns.  You will get an e-mail in the next two days asking about your experience.  I hope that your e-visit has been valuable and will speed your recovery. Thank you for using e-visits.   I spent approximately 5-10 minutes reviewing patient's chart and medical information.

## 2020-02-24 ENCOUNTER — Encounter (HOSPITAL_COMMUNITY): Payer: Self-pay

## 2020-02-24 ENCOUNTER — Emergency Department (HOSPITAL_COMMUNITY)
Admission: EM | Admit: 2020-02-24 | Discharge: 2020-02-24 | Disposition: A | Discharge status: Home / Self Care | Payer: No Typology Code available for payment source | Attending: Emergency Medicine | Admitting: Emergency Medicine

## 2020-02-24 ENCOUNTER — Other Ambulatory Visit: Payer: Self-pay

## 2020-02-24 DIAGNOSIS — M545 Low back pain, unspecified: Secondary | ICD-10-CM | POA: Diagnosis present

## 2020-02-24 DIAGNOSIS — Z5321 Procedure and treatment not carried out due to patient leaving prior to being seen by health care provider: Secondary | ICD-10-CM | POA: Diagnosis not present

## 2020-02-24 NOTE — ED Triage Notes (Addendum)
Pt reports left mid-lower back pain since Monday, pt did have urinary symptoms which have now subsided. Pt saw PCP today and urine looked ok, but wanted her to have CT scan for possible kidney stone. Pt does not appear in distress.

## 2020-02-25 ENCOUNTER — Ambulatory Visit (HOSPITAL_COMMUNITY)
Admission: RE | Admit: 2020-02-25 | Discharge: 2020-02-25 | Disposition: A | Discharge status: Home / Self Care | Payer: No Typology Code available for payment source | Source: Ambulatory Visit | Attending: Internal Medicine | Admitting: Internal Medicine

## 2020-02-25 ENCOUNTER — Other Ambulatory Visit (HOSPITAL_COMMUNITY): Payer: Self-pay | Admitting: Internal Medicine

## 2020-02-25 DIAGNOSIS — R52 Pain, unspecified: Secondary | ICD-10-CM | POA: Diagnosis present

## 2020-02-25 DIAGNOSIS — R300 Dysuria: Secondary | ICD-10-CM | POA: Diagnosis not present

## 2020-03-01 ENCOUNTER — Other Ambulatory Visit: Payer: Self-pay | Admitting: Internal Medicine

## 2020-03-01 ENCOUNTER — Other Ambulatory Visit (HOSPITAL_COMMUNITY): Payer: Self-pay | Admitting: Internal Medicine

## 2020-03-01 DIAGNOSIS — K769 Liver disease, unspecified: Secondary | ICD-10-CM

## 2020-03-13 ENCOUNTER — Ambulatory Visit (HOSPITAL_COMMUNITY): Payer: No Typology Code available for payment source

## 2020-03-20 ENCOUNTER — Ambulatory Visit (HOSPITAL_COMMUNITY)
Admission: RE | Admit: 2020-03-20 | Discharge: 2020-03-20 | Disposition: A | Discharge status: Home / Self Care | Payer: No Typology Code available for payment source | Source: Ambulatory Visit | Attending: Internal Medicine | Admitting: Internal Medicine

## 2020-03-20 DIAGNOSIS — K769 Liver disease, unspecified: Secondary | ICD-10-CM | POA: Diagnosis not present

## 2020-03-20 MED ORDER — GADOBUTROL 1 MMOL/ML IV SOLN
10.0000 mL | Freq: Once | INTRAVENOUS | Status: AC | PRN
  Administered 2020-03-20: 10 mL via INTRAVENOUS

## 2020-03-21 ENCOUNTER — Ambulatory Visit (HOSPITAL_COMMUNITY): Payer: No Typology Code available for payment source

## 2020-05-15 ENCOUNTER — Encounter (INDEPENDENT_AMBULATORY_CARE_PROVIDER_SITE_OTHER): Payer: Self-pay

## 2020-06-20 ENCOUNTER — Other Ambulatory Visit: Payer: Self-pay

## 2020-06-20 ENCOUNTER — Ambulatory Visit (INDEPENDENT_AMBULATORY_CARE_PROVIDER_SITE_OTHER): Payer: No Typology Code available for payment source | Admitting: Family Medicine

## 2020-06-20 ENCOUNTER — Encounter (INDEPENDENT_AMBULATORY_CARE_PROVIDER_SITE_OTHER): Payer: Self-pay | Admitting: Family Medicine

## 2020-06-20 VITALS — BP 107/74 | HR 79 | Temp 98.6°F | Ht 64.0 in | Wt 218.0 lb

## 2020-06-20 DIAGNOSIS — R635 Abnormal weight gain: Secondary | ICD-10-CM

## 2020-06-20 DIAGNOSIS — E559 Vitamin D deficiency, unspecified: Secondary | ICD-10-CM | POA: Diagnosis not present

## 2020-06-20 DIAGNOSIS — R739 Hyperglycemia, unspecified: Secondary | ICD-10-CM

## 2020-06-20 DIAGNOSIS — F3289 Other specified depressive episodes: Secondary | ICD-10-CM | POA: Diagnosis not present

## 2020-06-20 DIAGNOSIS — Z0289 Encounter for other administrative examinations: Secondary | ICD-10-CM

## 2020-06-20 DIAGNOSIS — Z9189 Other specified personal risk factors, not elsewhere classified: Secondary | ICD-10-CM

## 2020-06-20 DIAGNOSIS — Z6837 Body mass index (BMI) 37.0-37.9, adult: Secondary | ICD-10-CM

## 2020-06-20 DIAGNOSIS — R0602 Shortness of breath: Secondary | ICD-10-CM | POA: Diagnosis not present

## 2020-06-20 DIAGNOSIS — R5383 Other fatigue: Secondary | ICD-10-CM

## 2020-06-21 ENCOUNTER — Encounter (INDEPENDENT_AMBULATORY_CARE_PROVIDER_SITE_OTHER): Payer: Self-pay | Admitting: Family Medicine

## 2020-06-21 LAB — ANEMIA PANEL
Ferritin: 34 ng/mL (ref 15–150)
Folate, Hemolysate: 351 ng/mL
Folate, RBC: 803 ng/mL (ref >498)
Hematocrit: 43.7 % (ref 34.0–46.6)
Iron Saturation: 22 % (ref 15–55)
Iron: 84 ug/dL (ref 27–159)
Retic Ct Pct: 1.5 % (ref 0.6–2.6)
Total Iron Binding Capacity: 389 ug/dL (ref 250–450)
UIBC: 305 ug/dL (ref 131–425)
Vitamin B-12: 614 pg/mL (ref 232–1245)

## 2020-06-21 LAB — LIPID PANEL
Chol/HDL Ratio: 3.3 ratio (ref 0.0–4.4)
Cholesterol, Total: 192 mg/dL (ref 100–199)
HDL: 58 mg/dL (ref >39)
LDL Chol Calc (NIH): 122 mg/dL — ABNORMAL HIGH (ref 0–99)
Triglycerides: 63 mg/dL (ref 0–149)
VLDL Cholesterol Cal: 12 mg/dL (ref 5–40)

## 2020-06-21 LAB — COMPREHENSIVE METABOLIC PANEL
ALT: 15 IU/L (ref 0–32)
AST: 18 IU/L (ref 0–40)
Albumin/Globulin Ratio: 2 (ref 1.2–2.2)
Albumin: 4.6 g/dL (ref 3.8–4.8)
Alkaline Phosphatase: 57 IU/L (ref 44–121)
BUN/Creatinine Ratio: 15 (ref 9–23)
BUN: 10 mg/dL (ref 6–20)
Bilirubin Total: 0.5 mg/dL (ref 0.0–1.2)
CO2: 19 mmol/L — ABNORMAL LOW (ref 20–29)
Calcium: 9.2 mg/dL (ref 8.7–10.2)
Chloride: 101 mmol/L (ref 96–106)
Creatinine, Ser: 0.68 mg/dL (ref 0.57–1.00)
Globulin, Total: 2.3 g/dL (ref 1.5–4.5)
Glucose: 81 mg/dL (ref 65–99)
Potassium: 4.5 mmol/L (ref 3.5–5.2)
Sodium: 136 mmol/L (ref 134–144)
Total Protein: 6.9 g/dL (ref 6.0–8.5)
eGFR: 115 mL/min/{1.73_m2} (ref >59)

## 2020-06-21 LAB — HEMOGLOBIN A1C
Est. average glucose Bld gHb Est-mCnc: 111 mg/dL
Hgb A1c MFr Bld: 5.5 % (ref 4.8–5.6)

## 2020-06-21 LAB — CBC WITH DIFFERENTIAL/PLATELET
Basophils Absolute: 0.1 10*3/uL (ref 0.0–0.2)
Basos: 1 %
EOS (ABSOLUTE): 0.3 10*3/uL (ref 0.0–0.4)
Eos: 7 %
Hemoglobin: 13.9 g/dL (ref 11.1–15.9)
Immature Grans (Abs): 0 10*3/uL (ref 0.0–0.1)
Immature Granulocytes: 0 %
Lymphocytes Absolute: 1.4 10*3/uL (ref 0.7–3.1)
Lymphs: 28 %
MCH: 28 pg (ref 26.6–33.0)
MCHC: 31.8 g/dL (ref 31.5–35.7)
MCV: 88 fL (ref 79–97)
Monocytes Absolute: 0.4 10*3/uL (ref 0.1–0.9)
Monocytes: 8 %
Neutrophils Absolute: 2.8 10*3/uL (ref 1.4–7.0)
Neutrophils: 56 %
Platelets: 244 10*3/uL (ref 150–450)
RBC: 4.97 x10E6/uL (ref 3.77–5.28)
RDW: 13.1 % (ref 11.7–15.4)
WBC: 5.1 10*3/uL (ref 3.4–10.8)

## 2020-06-21 LAB — T4, FREE: Free T4: 1.18 ng/dL (ref 0.82–1.77)

## 2020-06-21 LAB — VITAMIN D 25 HYDROXY (VIT D DEFICIENCY, FRACTURES): Vit D, 25-Hydroxy: 21.4 ng/mL — ABNORMAL LOW (ref 30.0–100.0)

## 2020-06-21 LAB — INSULIN, RANDOM: INSULIN: 8 u[IU]/mL (ref 2.6–24.9)

## 2020-06-21 LAB — TSH: TSH: 1.56 u[IU]/mL (ref 0.450–4.500)

## 2020-06-22 NOTE — Progress Notes (Signed)
Chief Complaint:   OBESITY [name] (MR# [id number]) is a 40 y.o. female who presents for evaluation and treatment of obesity and related comorbidities. Current BMI is Body mass index is 37.42 kg/m. Jessica Levine has been struggling with her weight for many years and has been unsuccessful in either losing weight, maintaining weight loss, or reaching her healthy weight goal.  Jessica Levine is currently in the action stage of change and ready to dedicate time achieving and maintaining a healthier weight. Jessica Levine is interested in becoming our patient and working on intensive lifestyle modifications including (but not limited to) diet and exercise for weight loss.  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  She is married, and lives with her husband and 3 daughters (30, 65, 48).  She drinks sweet tea.  She has taken phentermine in the past and says it was too stimulating.  She has no available labs.  Jessica Levine's habits were reviewed today and are as follows: Her family eats meals together, she thinks her family will eat healthier with her, her desired weight loss is 82 pounds, she started gaining weight at 40 years of age, her heaviest weight ever was 217 pounds, she craves sweets, she snacks frequently in the evenings, she skips breakfast sometimes, she is frequently drinking liquids with calories, she sometimes makes poor food choices and she struggles with emotional eating.  Jessica Levine provided the following food recall today: Varies due to kid's sports.  Breakfast:  Skips breakfast or bacon/egg/cheese sandwich in cafe. Lunch:  Leftovers. Dinner:  Meat, vegetable, starch.   Drinks:  Coffee with oat milk creamer.  Sweet tea - medium. Does not snack between meals but does more eating at night before bed (cereal).  Depression Screen Jessica Levine's Food and Mood (modified PHQ-9) score was 12.  Depression screen [facility] 2/9 06/20/2020  Decreased Interest 2  Down, Depressed, Hopeless 2  PHQ - 2 Score 4   Altered sleeping 1  Tired, decreased energy 1  Change in appetite 2  Feeling bad or failure about yourself  1  Trouble concentrating 1  Moving slowly or fidgety/restless 2  Suicidal thoughts 0  PHQ-9 Score 12  Difficult doing work/chores Not difficult at all   Assessment/Plan:   1. Other fatigue [name] admits to daytime somnolence and denies waking up still tired. Patent has a history of symptoms of daytime fatigue. Jessica Levine generally gets 7-9 hours of sleep per night, and states that she has generally restful sleep. Snoring is not present. Apneic episodes are not present. Epworth Sleepiness Score is 6.  Jessica Levine does feel that her weight is causing her energy to be lower than it should be. Fatigue may be related to obesity, depression or many other causes. Labs will be ordered, and in the meanwhile, Jessica Levine will focus on self care including making healthy food choices, increasing physical activity and focusing on stress reduction.  - EKG 12-Lead - Anemia panel - CBC with Differential/Platelet - Comprehensive metabolic panel - Hemoglobin A1c - Insulin, random - Lipid panel - VITAMIN D 25 Hydroxy (Vit-D Deficiency, Fractures) - TSH - T4, free  2. SOB (shortness of breath) on exertion Jessica Levine notes increasing shortness of breath with exercising and seems to be worsening over time with weight gain. She notes getting out of breath sooner with activity than she used to. This has gotten worse recently. Jessica Levine denies shortness of breath at rest or orthopnea.  Jessica Levine does feel that she gets out of breath more  easily that she used to when she exercises. Jessica Levine's shortness of breath appears to be obesity related and exercise induced. She has agreed to work on weight loss and gradually increase exercise to treat her exercise induced shortness of breath. Will continue to monitor closely.  - Anemia panel - CBC with Differential/Platelet - Comprehensive metabolic panel - Hemoglobin A1c - Insulin,  random - Lipid panel - VITAMIN D 25 Hydroxy (Vit-D Deficiency, Fractures) - TSH - T4, free  3. Vitamin D deficiency Jessica Levine is not currently taking a vitamin D supplement.  Optimal goal > 50 ng/dL.   Plan: Will check vitamin D level today, as per below.  - VITAMIN D 25 Hydroxy (Vit-D Deficiency, Fractures)  4. Hyperglycemia Will check CMP, A1c, and insulin level today, as per below.  - Comprehensive metabolic panel - Hemoglobin A1c - Insulin, random  5. Abnormal weight gain Will check TSH and free T4 today.  - TSH - T4, free  6. Other depression, with emotional eating Not at goal. Medication: None.  Plan:  Behavior modification techniques were discussed today to help deal with emotional/non-hunger eating behaviors.  7. At risk for heart disease Due to [name]'s current state of health and medical condition(s), she is at a higher risk for heart disease.  This puts the patient at much greater risk to subsequently develop cardiopulmonary conditions that can significantly affect patient's quality of life in a negative manner.    At least 9 minutes were spent on counseling Jessica Levine about these concerns today. Evidence-based interventions for health behavior change were utilized today including the discussion of self monitoring techniques, problem-solving barriers, and SMART goal setting techniques.  Specifically, regarding patient's less desirable eating habits and patterns, we employed the technique of small changes when Jessica Levine has not been able to fully commit to her prudent nutritional plan.  8. Class 2 severe obesity with serious comorbidity and body mass index (BMI) of 37.0 to 37.9 in adult, unspecified obesity type (HCC)  Jessica Levine is currently in the action stage of change and her goal is to continue with weight loss efforts. I recommend Jessica Levine begin the structured treatment plan as follows:  She has agreed to the Category 2 Plan.  Exercise goals: No exercise has been prescribed  at this time.   Behavioral modification strategies: increasing lean protein intake, decreasing simple carbohydrates, increasing vegetables, increasing water intake, decreasing liquid calories, decreasing alcohol intake, decreasing sodium intake and increasing high fiber foods.  She was informed of the importance of frequent follow-up visits to maximize her success with intensive lifestyle modifications for her multiple health conditions. She was informed we would discuss her lab results at her next visit unless there is a critical issue that needs to be addressed sooner. Bret agreed to keep her next visit at the agreed upon time to discuss these results.  Objective:   Blood pressure 107/74, pulse 79, temperature 98.6 F (37 C), temperature source Oral, height 5\' 4"  (1.626 m), weight 218 lb (98.9 kg), last menstrual period 06/07/2020, SpO2 98 %, unknown if currently breastfeeding. Body mass index is 37.42 kg/m.  EKG: Normal sinus rhythm, rate 83 bpm.  Indirect Calorimeter completed today shows a VO2 of 254 and a REE of 1771.  Her calculated basal metabolic rate is 1093 thus her basal metabolic rate is better than expected.  General: Cooperative, alert, well developed, in no acute distress. HEENT: Conjunctivae and lids unremarkable. Cardiovascular: Regular rhythm.  Lungs: Normal work of breathing. Neurologic: No focal deficits.   Lab  Results  Component Value Date   CREATININE 0.68 06/20/2020   BUN 10 06/20/2020   NA 136 06/20/2020   K 4.5 06/20/2020   CL 101 06/20/2020   CO2 19 (L) 06/20/2020   Lab Results  Component Value Date   ALT 15 06/20/2020   AST 18 06/20/2020   ALKPHOS 57 06/20/2020   BILITOT 0.5 06/20/2020   Lab Results  Component Value Date   HGBA1C 5.5 06/20/2020   Lab Results  Component Value Date   INSULIN 8.0 06/20/2020   Lab Results  Component Value Date   TSH 1.560 06/20/2020   Lab Results  Component Value Date   CHOL 192 06/20/2020   HDL 58  06/20/2020   LDLCALC 122 (H) 06/20/2020   TRIG 63 06/20/2020   CHOLHDL 3.3 06/20/2020   Lab Results  Component Value Date   WBC 5.1 06/20/2020   HGB 13.9 06/20/2020   HCT 43.7 06/20/2020   MCV 88 06/20/2020   PLT 244 06/20/2020   Lab Results  Component Value Date   IRON 84 06/20/2020   TIBC 389 06/20/2020   FERRITIN 34 06/20/2020   Attestation Statements:   This is the patient's first visit at [facility]. The patient's NEW PATIENT PACKET was reviewed at length. Included in the packet: current and past health history, medications, allergies, ROS, gynecologic history (women only), surgical history, family history, social history, weight history, weight loss surgery history (for those that have had weight loss surgery), nutritional evaluation, mood and food questionnaire, PHQ9, Epworth questionnaire, sleep habits questionnaire, patient life and health improvement goals questionnaire. These will all be scanned into the patient's chart under media.   During the visit, I independently reviewed the patient's EKG, bioimpedance scale results, and indirect calorimeter results. I used this information to tailor a meal plan for the patient that will help her to lose weight and will improve her obesity-related conditions going forward. I performed a medically necessary appropriate examination and/or evaluation. I discussed the assessment and treatment plan with the patient. The patient was provided an opportunity to ask questions and all were answered. The patient agreed with the plan and demonstrated an understanding of the instructions. Labs were ordered at this visit and will be reviewed at the next visit unless more critical results need to be addressed immediately. Clinical information was updated and documented in the EMR.   I, Water quality scientist, CMA, am acting as transcriptionist for Briscoe Deutscher, DO  I have reviewed the above documentation for accuracy and completeness, and I agree  with the above. Briscoe Deutscher, DO

## 2020-07-04 ENCOUNTER — Ambulatory Visit (INDEPENDENT_AMBULATORY_CARE_PROVIDER_SITE_OTHER): Payer: No Typology Code available for payment source | Admitting: Family Medicine

## 2020-07-18 ENCOUNTER — Ambulatory Visit (INDEPENDENT_AMBULATORY_CARE_PROVIDER_SITE_OTHER): Payer: No Typology Code available for payment source | Admitting: Family Medicine

## 2020-07-18 ENCOUNTER — Other Ambulatory Visit (HOSPITAL_COMMUNITY): Payer: Self-pay

## 2020-07-18 ENCOUNTER — Other Ambulatory Visit: Payer: Self-pay

## 2020-07-18 ENCOUNTER — Encounter (INDEPENDENT_AMBULATORY_CARE_PROVIDER_SITE_OTHER): Payer: Self-pay | Admitting: Family Medicine

## 2020-07-18 VITALS — BP 114/78 | HR 72 | Temp 98.4°F | Ht 64.0 in | Wt 215.0 lb

## 2020-07-18 DIAGNOSIS — E7800 Pure hypercholesterolemia, unspecified: Secondary | ICD-10-CM

## 2020-07-18 DIAGNOSIS — E559 Vitamin D deficiency, unspecified: Secondary | ICD-10-CM | POA: Diagnosis not present

## 2020-07-18 DIAGNOSIS — E8881 Metabolic syndrome: Secondary | ICD-10-CM | POA: Diagnosis not present

## 2020-07-18 DIAGNOSIS — Z9189 Other specified personal risk factors, not elsewhere classified: Secondary | ICD-10-CM

## 2020-07-18 DIAGNOSIS — Z6837 Body mass index (BMI) 37.0-37.9, adult: Secondary | ICD-10-CM

## 2020-07-18 DIAGNOSIS — E7849 Other hyperlipidemia: Secondary | ICD-10-CM

## 2020-07-18 MED ORDER — VITAMIN D (ERGOCALCIFEROL) 1.25 MG (50000 UNIT) PO CAPS
50000.0000 [IU] | ORAL_CAPSULE | ORAL | 0 refills | Status: DC
  Filled 2020-07-18: qty 4, 28d supply, fill #0

## 2020-07-19 NOTE — Progress Notes (Signed)
Chief Complaint:   OBESITY Jessica Levine is here to discuss her progress with her obesity treatment plan along with follow-up of her obesity related diagnoses. Jessica Levine is on the Category 2 Plan and states she is following her eating plan approximately 70% of the time. Jessica Levine states she is doing 0 minutes 0 times per week.  Today's visit was #: 2 Starting weight: 218 lbs Starting date: 06/20/2020 Today's weight: 215 lbs Today's date: 07/18/2020 Total lbs lost to date: 3 Total lbs lost since last in-office visit: 3  Interim History: Jessica Levine has been working on Category 2 plan, but she struggling with increased eating out and meal planning especially due to her busy job and busy family. She is trying to be healthier trying to make healthier eating choices.  Subjective:   1. Hyperlipidemia, pure Jessica Levine has a new diagnosis of hyperlipidemia. Her LDL is elevated but HDL and triglycerides are within normal limits. She denies chest pain, and she is working on diet and weight loss. I discussed labs with the patient today.  2. Vitamin D deficiency Jessica Levine is not on Vit D, and her level has been low in the past. She notes fatigue. I discussed labs with the patient today.  3. Insulin resistance Jessica Levine has a new diagnosis of insulin resistance. Her fasting glucose and A1c are within normal limits. Her fasting insulin is >5, which is likely contributing to her weight gain and polyphagia. I discussed labs with the patient today.  4. At risk for diabetes mellitus Jessica Levine is at higher than average risk for developing diabetes due to obesity.   Assessment/Plan:   1. Hyperlipidemia, pure Cardiovascular risk and specific lipid/LDL goals reviewed. We discussed several lifestyle modifications today. We will recheck labs in 3 month. Jessica Levine will continue to work on diet, exercise and weight loss efforts. Orders and follow up as documented in patient record.   2. Vitamin D deficiency Low Vitamin D level  contributes to fatigue and are associated with obesity, breast, and colon cancer. Jessica Levine agreed to start prescription Vitamin D 50,000 IU every week with no refills. We will recheck labs in 3 months, and she will follow-up for routine testing of Vitamin D, at least 2-3 times per year to avoid over-replacement.  - Vitamin D, Ergocalciferol, (DRISDOL) 1.25 MG (50000 UNIT) CAPS capsule; Take 1 capsule (50,000 Units total) by mouth every 7 (seven) days.  Dispense: 4 capsule; Refill: 0  3. Insulin resistance Jessica Levine will defer metformin for now, and will continue to work on diet, exercise, and decreasing simple carbohydrates to help decrease the risk of diabetes. We will recheck labs in 3 months. Jessica Levine agreed to follow-up with [place] as directed to closely monitor her progress.  4. At risk for diabetes mellitus Jessica Levine was given approximately 30 minutes of diabetes education and counseling today. We discussed intensive lifestyle modifications today with an emphasis on weight loss as well as increasing exercise and decreasing simple carbohydrates in her diet. We also reviewed medication options with an emphasis on risk versus benefit of those discussed.   Repetitive spaced learning was employed today to elicit superior memory formation and behavioral change.  5. Obesity with current BMI 37.0 Jessica Levine is currently in the action stage of change. As such, her goal is to continue with weight loss efforts. She has agreed to the Category 2 Plan or keeping a food journal and adhering to recommended goals of 1100-1400 calories and 80+ grams of protein daily.   Behavioral modification strategies: decreasing simple  carbohydrates.  Jessica Levine has agreed to follow-up with our clinic in 2 to 3 weeks. She was informed of the importance of frequent follow-up visits to maximize her success with intensive lifestyle modifications for her multiple health conditions.   Objective:   Blood pressure 114/78, pulse 72, temperature 98.4  F (36.9 C), height 5\' 4"  (1.626 m), weight 215 lb (97.5 kg), SpO2 98 %, unknown if currently breastfeeding. Body mass index is 36.9 kg/m.  General: Cooperative, alert, well developed, in no acute distress. HEENT: Conjunctivae and lids unremarkable. Cardiovascular: Regular rhythm.  Lungs: Normal work of breathing. Neurologic: No focal deficits.   Lab Results  Component Value Date   CREATININE 0.68 06/20/2020   BUN 10 06/20/2020   NA 136 06/20/2020   K 4.5 06/20/2020   CL 101 06/20/2020   CO2 19 (L) 06/20/2020   Lab Results  Component Value Date   ALT 15 06/20/2020   AST 18 06/20/2020   ALKPHOS 57 06/20/2020   BILITOT 0.5 06/20/2020   Lab Results  Component Value Date   HGBA1C 5.5 06/20/2020   Lab Results  Component Value Date   INSULIN 8.0 06/20/2020   Lab Results  Component Value Date   TSH 1.560 06/20/2020   Lab Results  Component Value Date   CHOL 192 06/20/2020   HDL 58 06/20/2020   LDLCALC 122 (H) 06/20/2020   TRIG 63 06/20/2020   CHOLHDL 3.3 06/20/2020   Lab Results  Component Value Date   WBC 5.1 06/20/2020   HGB 13.9 06/20/2020   HCT 43.7 06/20/2020   MCV 88 06/20/2020   PLT 244 06/20/2020   Lab Results  Component Value Date   IRON 84 06/20/2020   TIBC 389 06/20/2020   FERRITIN 34 06/20/2020   Attestation Statements:   Reviewed by clinician on day of visit: allergies, medications, problem list, medical history, surgical history, family history, social history, and previous encounter notes.   I, Trixie Dredge, am acting as transcriptionist for Dennard Nip, MD.  I have reviewed the above documentation for accuracy and completeness, and I agree with the above. -  Dennard Nip, MD

## 2020-07-20 ENCOUNTER — Other Ambulatory Visit (HOSPITAL_COMMUNITY): Payer: Self-pay

## 2020-07-25 DIAGNOSIS — E8881 Metabolic syndrome: Secondary | ICD-10-CM | POA: Insufficient documentation

## 2020-07-25 DIAGNOSIS — E88819 Insulin resistance, unspecified: Secondary | ICD-10-CM | POA: Insufficient documentation

## 2020-07-25 DIAGNOSIS — E559 Vitamin D deficiency, unspecified: Secondary | ICD-10-CM | POA: Insufficient documentation

## 2020-07-25 DIAGNOSIS — E7849 Other hyperlipidemia: Secondary | ICD-10-CM | POA: Insufficient documentation

## 2020-08-08 ENCOUNTER — Other Ambulatory Visit: Payer: Self-pay

## 2020-08-08 ENCOUNTER — Other Ambulatory Visit (HOSPITAL_COMMUNITY): Payer: Self-pay

## 2020-08-08 ENCOUNTER — Encounter (INDEPENDENT_AMBULATORY_CARE_PROVIDER_SITE_OTHER): Payer: Self-pay | Admitting: Adult Health

## 2020-08-08 ENCOUNTER — Ambulatory Visit (INDEPENDENT_AMBULATORY_CARE_PROVIDER_SITE_OTHER): Payer: No Typology Code available for payment source | Admitting: Adult Health

## 2020-08-08 VITALS — BP 112/74 | HR 80 | Temp 98.4°F | Ht 64.0 in | Wt 215.0 lb

## 2020-08-08 DIAGNOSIS — Z9189 Other specified personal risk factors, not elsewhere classified: Secondary | ICD-10-CM

## 2020-08-08 DIAGNOSIS — E8881 Metabolic syndrome: Secondary | ICD-10-CM

## 2020-08-08 DIAGNOSIS — E559 Vitamin D deficiency, unspecified: Secondary | ICD-10-CM | POA: Diagnosis not present

## 2020-08-08 DIAGNOSIS — Z6837 Body mass index (BMI) 37.0-37.9, adult: Secondary | ICD-10-CM | POA: Diagnosis not present

## 2020-08-08 MED ORDER — VITAMIN D (ERGOCALCIFEROL) 1.25 MG (50000 UNIT) PO CAPS
50000.0000 [IU] | ORAL_CAPSULE | ORAL | 0 refills | Status: DC
  Filled 2020-08-08: qty 4, 28d supply, fill #0

## 2020-08-09 ENCOUNTER — Other Ambulatory Visit (HOSPITAL_COMMUNITY): Payer: Self-pay

## 2020-08-10 DIAGNOSIS — Z6837 Body mass index (BMI) 37.0-37.9, adult: Secondary | ICD-10-CM | POA: Insufficient documentation

## 2020-08-10 NOTE — Progress Notes (Signed)
Chief Complaint:   OBESITY Jessica Levine is here to discuss her progress with her obesity treatment plan along with follow-up of her obesity related diagnoses. Jessica Levine is on the Category 2 Plan and keeping a food journal and adhering to recommended goals of 1100-1400 calories and 80 g protein and states she is following her eating plan approximately 60-70% of the time. Jessica Levine states she is not currently exercising.  Today's visit was #: 3 Starting weight: 218 lbs Starting date: 06/20/2020 Today's weight: 215 lbs Today's date: 08/08/2020 Total lbs lost to date: 3 Total lbs lost since last in-office visit: 0  Interim History: [name] maintained her weight from last OV. Breakfast- high protein cereal Lunch- leftovers from dinner or meal from [employer]- protein with vegetables or eating out  Subjective:   1. Vitamin D deficiency [name]'s Vitamin D level was 21.4 on 06/20/2020- well below goal of 50. She is currently taking prescription vitamin D 50,000 IU each week. She denies nausea, vomiting or muscle weakness.  2. Insulin resistance 06/20/2020 BG 81, A1c 5.5, insulin level 8.0. Bryton denies polyphagia.  Lab Results  Component Value Date   INSULIN 8.0 06/20/2020   Lab Results  Component Value Date   HGBA1C 5.5 06/20/2020   3. At risk for osteoporosis [name] is at higher risk of osteopenia and osteoporosis due to Vitamin D deficiency.   Assessment/Plan:   1. Vitamin D deficiency Low Vitamin D level contributes to fatigue and are associated with obesity, breast, and colon cancer. She agrees to continue to take prescription Vitamin D @50 ,000 IU every week and will follow-up for routine testing of Vitamin D, at least 2-3 times per year to avoid over-replacement.  - Vitamin D, Ergocalciferol, (DRISDOL) 1.25 MG (50000 UNIT) CAPS capsule; Take 1 capsule (50,000 Units total) by mouth every 7 (seven) days.  Dispense: 4 capsule; Refill: 0  2. Insulin resistance [name] will  continue to work on weight loss, exercise, and decreasing simple carbohydrates to help decrease the risk of diabetes. [name] agreed to follow-up with [place] as directed to closely monitor her progress.  -Continue to decrease sugar/CHO and increase protein at each meal.  3. At risk for osteoporosis [name] was given approximately 15 minutes of osteoporosis prevention counseling today. [name] is at risk for osteopenia and osteoporosis due to her Vitamin D deficiency. She was encouraged to take her Vitamin D and follow her higher calcium diet and increase strengthening exercise to help strengthen her bones and decrease her risk of osteopenia and osteoporosis.  Repetitive spaced learning was employed today to elicit superior memory formation and behavioral change.  4. Obesity with current BMI 37.0  [name] is currently in the action stage of change. As such, her goal is to continue with weight loss efforts. She has agreed to keeping a food journal and adhering to recommended goals of 1100-1400 calories and 80 g protein.   Handout: Recipe Guide II  Exercise goals:  Increase daily walking  Behavioral modification strategies: increasing lean protein intake, decreasing simple carbohydrates, meal planning and cooking strategies, keeping healthy foods in the home, and planning for success.  [name] has agreed to follow-up with our clinic in 2-3 weeks. She was informed of the importance of frequent follow-up visits to maximize her success with intensive lifestyle modifications for her multiple health conditions.   Objective:   Blood pressure 112/74, pulse 80, temperature 98.4 F (36.9 C), height 5\' 4"  (1.626 m), weight 215 lb (97.5 kg), SpO2 98 %, unknown if currently  breastfeeding. Body mass index is 36.9 kg/m.  General: Cooperative, alert, well developed, in no acute distress. HEENT: Conjunctivae and lids unremarkable. Cardiovascular: Regular rhythm.  Lungs: Normal work of breathing. Neurologic: No  focal deficits.   Lab Results  Component Value Date   CREATININE 0.68 06/20/2020   BUN 10 06/20/2020   NA 136 06/20/2020   K 4.5 06/20/2020   CL 101 06/20/2020   CO2 19 (L) 06/20/2020   Lab Results  Component Value Date   ALT 15 06/20/2020   AST 18 06/20/2020   ALKPHOS 57 06/20/2020   BILITOT 0.5 06/20/2020   Lab Results  Component Value Date   HGBA1C 5.5 06/20/2020   Lab Results  Component Value Date   INSULIN 8.0 06/20/2020   Lab Results  Component Value Date   TSH 1.560 06/20/2020   Lab Results  Component Value Date   CHOL 192 06/20/2020   HDL 58 06/20/2020   LDLCALC 122 (H) 06/20/2020   TRIG 63 06/20/2020   CHOLHDL 3.3 06/20/2020   Lab Results  Component Value Date   WBC 5.1 06/20/2020   HGB 13.9 06/20/2020   HCT 43.7 06/20/2020   MCV 88 06/20/2020   PLT 244 06/20/2020   Lab Results  Component Value Date   IRON 84 06/20/2020   TIBC 389 06/20/2020   FERRITIN 34 06/20/2020    Attestation Statements:   Reviewed by clinician on day of visit: allergies, medications, problem list, medical history, surgical history, family history, social history, and previous encounter notes.  Coral Ceo, CMA, am acting as transcriptionist for Mina Marble, NP.  I have reviewed the above documentation for accuracy and completeness, and I agree with the above. -  Faiz Weber d. Gissell Barra, NP-C

## 2020-08-24 ENCOUNTER — Ambulatory Visit (INDEPENDENT_AMBULATORY_CARE_PROVIDER_SITE_OTHER): Payer: No Typology Code available for payment source | Admitting: Family Medicine

## 2020-09-07 ENCOUNTER — Other Ambulatory Visit (HOSPITAL_COMMUNITY): Payer: Self-pay

## 2020-09-07 ENCOUNTER — Ambulatory Visit (INDEPENDENT_AMBULATORY_CARE_PROVIDER_SITE_OTHER): Payer: No Typology Code available for payment source | Admitting: Family Medicine

## 2020-09-07 ENCOUNTER — Other Ambulatory Visit: Payer: Self-pay

## 2020-09-07 ENCOUNTER — Encounter (INDEPENDENT_AMBULATORY_CARE_PROVIDER_SITE_OTHER): Payer: Self-pay | Admitting: Family Medicine

## 2020-09-07 VITALS — BP 116/81 | HR 84 | Temp 98.0°F | Ht 64.0 in | Wt 218.0 lb

## 2020-09-07 DIAGNOSIS — Z6837 Body mass index (BMI) 37.0-37.9, adult: Secondary | ICD-10-CM | POA: Diagnosis not present

## 2020-09-07 DIAGNOSIS — E559 Vitamin D deficiency, unspecified: Secondary | ICD-10-CM | POA: Diagnosis not present

## 2020-09-07 DIAGNOSIS — Z9189 Other specified personal risk factors, not elsewhere classified: Secondary | ICD-10-CM | POA: Diagnosis not present

## 2020-09-07 DIAGNOSIS — E7849 Other hyperlipidemia: Secondary | ICD-10-CM | POA: Diagnosis not present

## 2020-09-07 MED ORDER — VITAMIN D (ERGOCALCIFEROL) 1.25 MG (50000 UNIT) PO CAPS
50000.0000 [IU] | ORAL_CAPSULE | ORAL | 0 refills | Status: DC
  Filled 2020-09-07: qty 4, 28d supply, fill #0

## 2020-09-14 NOTE — Progress Notes (Signed)
Chief Complaint:   OBESITY Jessica Levine is here to discuss her progress with her obesity treatment plan along with follow-up of her obesity related diagnoses. Jessica Levine is on keeping a food journal and adhering to recommended goals of 1100-1400 calories and 80 g protein and states she is following her eating plan approximately 60% of the time. Jessica Levine states she is not currently exercising.  Today's visit was #: 4 Starting weight: 218 lbs Starting date: 06/20/2020 Today's weight: 218 lbs Today's date: 09/07/2020 Total lbs lost to date: 0 Total lbs lost since last in-office visit: 0  Interim History: Jessica Levine went on vacation and is having a hard tine committing to the program. She is not sure what would be easiest. She has journaled in the past. She is going to be out of town next weekend.  Subjective:   1. Vitamin D deficiency Pt denies nausea, vomiting, and muscle weakness but notes fatigue. Pt is on prescription Vit D.  2. Hyperlipidemia, pure Her LDL is 122, HDL 58, and triglycerides 63. She is not on a statin.  3. At risk for osteoporosis Jessica Levine is at higher risk of osteopenia and osteoporosis due to Vitamin D deficiency.   Assessment/Plan:   1. Vitamin D deficiency Low Vitamin D level contributes to fatigue and are associated with obesity, breast, and colon cancer. She agrees to continue to take prescription Vitamin D @50 ,000 IU every week and will follow-up for routine testing of Vitamin D, at least 2-3 times per year to avoid over-replacement.  Refill- Vitamin D, Ergocalciferol, (DRISDOL) 1.25 MG (50000 UNIT) CAPS capsule; Take 1 capsule (50,000 Units total) by mouth every 7 (seven) days.  Dispense: 4 capsule; Refill: 0  2. Hyperlipidemia, pure Cardiovascular risk and specific lipid/LDL goals reviewed.  We discussed several lifestyle modifications today and Jessica Levine will continue to work on diet, exercise and weight loss efforts. Orders and follow up as documented in patient record.  Repeat labs in August.  Counseling Intensive lifestyle modifications are the first line treatment for this issue. Dietary changes: Increase soluble fiber. Decrease simple carbohydrates. Exercise changes: Moderate to vigorous-intensity aerobic activity 150 minutes per week if tolerated. Lipid-lowering medications: see documented in medical record.  3. At risk for osteoporosis Jessica Levine was given approximately 15 minutes of osteoporosis prevention counseling today. Jessica Levine is at risk for osteopenia and osteoporosis due to her Vitamin D deficiency. She was encouraged to take her Vitamin D and follow her higher calcium diet and increase strengthening exercise to help strengthen her bones and decrease her risk of osteopenia and osteoporosis.  Repetitive spaced learning was employed today to elicit superior memory formation and behavioral change.  4. Obesity with current BMI 37.5  Jessica Levine is currently in the action stage of change. As such, her goal is to continue with weight loss efforts. She has agreed to keeping a food journal and adhering to recommended goals of 1200-1400 calories and 85+ g protein.   Exercise goals:  As is  Behavioral modification strategies: increasing lean protein intake, decreasing eating out, meal planning and cooking strategies, and planning for success.  Jessica Levine has agreed to follow-up with our clinic in 2 weeks. She was informed of the importance of frequent follow-up visits to maximize her success with intensive lifestyle modifications for her multiple health conditions.   Objective:   Blood pressure 116/81, pulse 84, temperature 98 F (36.7 C), height 5\' 4"  (1.626 m), weight 218 lb (98.9 kg), SpO2 97 %, unknown if currently breastfeeding. Body mass index is  37.42 kg/m.  General: Cooperative, alert, well developed, in no acute distress. HEENT: Conjunctivae and lids unremarkable. Cardiovascular: Regular rhythm.  Lungs: Normal work of breathing. Neurologic: No focal  deficits.   Lab Results  Component Value Date   CREATININE 0.68 06/20/2020   BUN 10 06/20/2020   NA 136 06/20/2020   K 4.5 06/20/2020   CL 101 06/20/2020   CO2 19 (L) 06/20/2020   Lab Results  Component Value Date   ALT 15 06/20/2020   AST 18 06/20/2020   ALKPHOS 57 06/20/2020   BILITOT 0.5 06/20/2020   Lab Results  Component Value Date   HGBA1C 5.5 06/20/2020   Lab Results  Component Value Date   INSULIN 8.0 06/20/2020   Lab Results  Component Value Date   TSH 1.560 06/20/2020   Lab Results  Component Value Date   CHOL 192 06/20/2020   HDL 58 06/20/2020   LDLCALC 122 (H) 06/20/2020   TRIG 63 06/20/2020   CHOLHDL 3.3 06/20/2020   Lab Results  Component Value Date   VD25OH 21.4 (L) 06/20/2020   Lab Results  Component Value Date   WBC 5.1 06/20/2020   HGB 13.9 06/20/2020   HCT 43.7 06/20/2020   MCV 88 06/20/2020   PLT 244 06/20/2020   Lab Results  Component Value Date   IRON 84 06/20/2020   TIBC 389 06/20/2020   FERRITIN 34 06/20/2020    Attestation Statements:   Reviewed by clinician on day of visit: allergies, medications, problem list, medical history, surgical history, family history, social history, and previous encounter notes.  Coral Ceo, CMA, am acting as transcriptionist for Coralie Common, MD.  I have reviewed the above documentation for accuracy and completeness, and I agree with the above. - Coralie Common, MD

## 2020-09-19 ENCOUNTER — Encounter (INDEPENDENT_AMBULATORY_CARE_PROVIDER_SITE_OTHER): Payer: Self-pay

## 2020-09-21 ENCOUNTER — Other Ambulatory Visit: Payer: Self-pay

## 2020-09-21 ENCOUNTER — Ambulatory Visit (INDEPENDENT_AMBULATORY_CARE_PROVIDER_SITE_OTHER): Payer: No Typology Code available for payment source | Admitting: Family Medicine

## 2020-09-21 ENCOUNTER — Encounter (INDEPENDENT_AMBULATORY_CARE_PROVIDER_SITE_OTHER): Payer: Self-pay | Admitting: Family Medicine

## 2020-09-21 VITALS — BP 109/73 | HR 79 | Temp 98.0°F | Ht 64.0 in | Wt 217.0 lb

## 2020-09-21 DIAGNOSIS — E559 Vitamin D deficiency, unspecified: Secondary | ICD-10-CM | POA: Diagnosis not present

## 2020-09-21 DIAGNOSIS — Z6837 Body mass index (BMI) 37.0-37.9, adult: Secondary | ICD-10-CM

## 2020-09-25 NOTE — Progress Notes (Signed)
Chief Complaint:   OBESITY Jessica Levine is here to discuss her progress with her obesity treatment plan along with follow-up of her obesity related diagnoses. Jessica Levine is on the Category 2 Plan and keeping a food journal and adhering to recommended goals of 1200-1400 calories and 85+ grams protein and states she is following her eating plan approximately 80% of the time. Jessica Levine states she is not currently exercising.  Today's visit was #: 5 Starting weight: 218 lbs Starting date: 06/20/2020 Today's weight: 217 lbs Today's date: 09/21/2020 Total lbs lost to date: 1 Total lbs lost since last in-office visit: 1  Interim History: Jessica Levine felt the past 2 weeks was easier to adhere to the plan because follow up was closer. She had softball and was able to get salads and not be swayed with indulgent eating.  Subjective:   1. Vitamin D deficiency Jessica Levine denies nausea, vomiting, and muscle weakness but notes fatigue. She is on prescription Vit D weekly.  Assessment/Plan:   1. Vitamin D deficiency Low Vitamin D level contributes to fatigue and are associated with obesity, breast, and colon cancer. She agrees to continue to take prescription Vitamin D '@50'$ ,000 IU every week and will follow-up for routine testing of Vitamin D, at least 2-3 times per year to avoid over-replacement. No refill needed at this time.  2. Obesity with current BMI of 37.4  Jessica Levine is currently in the action stage of change. As such, her goal is to continue with weight loss efforts. She has agreed to the Category 2 Plan and keeping a food journal and adhering to recommended goals of 1200-1400 calories and 85+ grams protein.   Exercise goals:  Pt is to start thinking about what activity she wants to start.  Behavioral modification strategies: increasing lean protein intake, meal planning and cooking strategies, keeping healthy foods in the home, and planning for success.  Jessica Levine has agreed to follow-up with our clinic in 2  weeks. She was informed of the importance of frequent follow-up visits to maximize her success with intensive lifestyle modifications for her multiple health conditions.   Objective:   Blood pressure 109/73, pulse 79, temperature 98 F (36.7 C), height '5\' 4"'$  (1.626 m), weight 217 lb (98.4 kg), last menstrual period 09/16/2020, SpO2 98 %, unknown if currently breastfeeding. Body mass index is 37.25 kg/m.  General: Cooperative, alert, well developed, in no acute distress. HEENT: Conjunctivae and lids unremarkable. Cardiovascular: Regular rhythm.  Lungs: Normal work of breathing. Neurologic: No focal deficits.   Lab Results  Component Value Date   CREATININE 0.68 06/20/2020   BUN 10 06/20/2020   NA 136 06/20/2020   K 4.5 06/20/2020   CL 101 06/20/2020   CO2 19 (L) 06/20/2020   Lab Results  Component Value Date   ALT 15 06/20/2020   AST 18 06/20/2020   ALKPHOS 57 06/20/2020   BILITOT 0.5 06/20/2020   Lab Results  Component Value Date   HGBA1C 5.5 06/20/2020   Lab Results  Component Value Date   INSULIN 8.0 06/20/2020   Lab Results  Component Value Date   TSH 1.560 06/20/2020   Lab Results  Component Value Date   CHOL 192 06/20/2020   HDL 58 06/20/2020   LDLCALC 122 (H) 06/20/2020   TRIG 63 06/20/2020   CHOLHDL 3.3 06/20/2020   Lab Results  Component Value Date   VD25OH 21.4 (L) 06/20/2020   Lab Results  Component Value Date   WBC 5.1 06/20/2020   HGB 13.9  06/20/2020   HCT 43.7 06/20/2020   MCV 88 06/20/2020   PLT 244 06/20/2020   Lab Results  Component Value Date   IRON 84 06/20/2020   TIBC 389 06/20/2020   FERRITIN 34 06/20/2020    Attestation Statements:   Reviewed by clinician on day of visit: allergies, medications, problem list, medical history, surgical history, family history, social history, and previous encounter notes.  Coral Ceo, CMA, am acting as transcriptionist for Coralie Common, MD.   I have reviewed the above  documentation for accuracy and completeness, and I agree with the above. - Coralie Common, MD

## 2020-09-27 ENCOUNTER — Other Ambulatory Visit (HOSPITAL_COMMUNITY): Payer: Self-pay

## 2020-10-10 ENCOUNTER — Other Ambulatory Visit (HOSPITAL_COMMUNITY): Payer: Self-pay

## 2020-10-10 ENCOUNTER — Encounter (INDEPENDENT_AMBULATORY_CARE_PROVIDER_SITE_OTHER): Payer: Self-pay | Admitting: Family Medicine

## 2020-10-10 ENCOUNTER — Ambulatory Visit (INDEPENDENT_AMBULATORY_CARE_PROVIDER_SITE_OTHER): Payer: No Typology Code available for payment source | Admitting: Family Medicine

## 2020-10-10 ENCOUNTER — Other Ambulatory Visit: Payer: Self-pay

## 2020-10-10 VITALS — BP 117/79 | HR 85 | Temp 98.1°F | Ht 64.0 in | Wt 217.0 lb

## 2020-10-10 DIAGNOSIS — Z6837 Body mass index (BMI) 37.0-37.9, adult: Secondary | ICD-10-CM

## 2020-10-10 DIAGNOSIS — Z9189 Other specified personal risk factors, not elsewhere classified: Secondary | ICD-10-CM

## 2020-10-10 DIAGNOSIS — E8881 Metabolic syndrome: Secondary | ICD-10-CM | POA: Diagnosis not present

## 2020-10-10 DIAGNOSIS — E559 Vitamin D deficiency, unspecified: Secondary | ICD-10-CM | POA: Diagnosis not present

## 2020-10-10 MED ORDER — VITAMIN D (ERGOCALCIFEROL) 1.25 MG (50000 UNIT) PO CAPS
50000.0000 [IU] | ORAL_CAPSULE | ORAL | 0 refills | Status: DC
  Filled 2020-10-10: qty 4, 28d supply, fill #0

## 2020-10-11 NOTE — Progress Notes (Signed)
Chief Complaint:   OBESITY Jessica Levine is here to discuss her progress with her obesity treatment plan along with follow-up of her obesity related diagnoses. Jessica Levine is on keeping a food journal and adhering to recommended goals of 1200-1400 calories and 85 grams protein and states she is following her eating plan approximately 60% of the time. Jessica Levine states she is not currently exercising.  Today's visit was #: 6 Starting weight: 218 lbs Starting date: 06/20/2020 Today's weight: 217 lbs Today's date: 10/10/2020 Total lbs lost to date: 1 Total lbs lost since last in-office visit: 0  Interim History: Jessica Levine has been on a break from all activities over the last few weeks. 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 She wants to get back to the gym. Food wise, she wants to meal prep and get back to eating on plan. Really this means having lunch portioned out and planned out.  Subjective:   1. Vitamin D deficiency Zer denies nausea, vomiting, and muscle weakness but notes fatigue. Pt is on prescription Vit D.  2. Insulin resistance Her last A1c was 5.5 and insulin level 8.0. She is not on medication.  3. At risk for osteoporosis Jessica Levine is at higher risk of osteopenia and osteoporosis due to Vitamin D deficiency.   Assessment/Plan:   1. Vitamin D deficiency Low Vitamin D level contributes to fatigue and are associated with obesity, breast, and colon cancer. She agrees to continue to take prescription Vitamin D 50,000 IU every week and will follow-up for routine testing of Vitamin D, at least 2-3 times per year to avoid over-replacement.  Refill- Vitamin D, Ergocalciferol, (DRISDOL) 1.25 MG (50000 UNIT) CAPS capsule; Take 1 capsule (50,000 Units total) by mouth every 7 (seven) days.  Dispense: 4 capsule; Refill: 0  2. Insulin resistance Jessica Levine will continue to work on weight loss, exercise, and decreasing simple carbohydrates to  help decrease the risk of diabetes. Jessica Levine agreed to follow-up with [place] as directed to closely monitor her progress. Repeat labs at next appt.  3. At risk for osteoporosis Jessica Levine was given approximately 15 minutes of osteoporosis prevention counseling today. Jessica Levine is at risk for osteopenia and osteoporosis due to her Vitamin D deficiency. She was encouraged to take her Vitamin D and follow her higher calcium diet and increase strengthening exercise to help strengthen her bones and decrease her risk of osteopenia and osteoporosis.  Repetitive spaced learning was employed today to elicit superior memory formation and behavioral change.  4. Obesity with current BMI of 37.3  Jessica Levine is currently in the action stage of change. As such, her goal is to continue with weight loss efforts. She has agreed to keeping a food journal and adhering to recommended goals of 1200-1400 calories and 85+ grams protein.   Exercise goals: All adults should avoid inactivity. Some physical activity is better than none, and adults who participate in any amount of physical activity gain some health benefits.  Behavioral modification strategies: increasing lean protein intake, meal planning and cooking strategies, keeping healthy foods in the home, and planning for success.  Jessica Levine has agreed to follow-up with our clinic in 2-3 weeks. She was informed of the importance of frequent follow-up visits to maximize her success with intensive lifestyle modifications for her multiple health conditions.   Objective:   Blood pressure 117/79, pulse 85, temperature 98.1 F (36.7 C), height '5\' 4"'$  (1.626 m), weight 217 lb (98.4 kg), last menstrual period  09/16/2020, SpO2 99 %, unknown if currently breastfeeding. Body mass index is 37.25 kg/m.  General: Cooperative, alert, well developed, in no acute distress. HEENT: Conjunctivae and lids unremarkable. Cardiovascular: Regular rhythm.  Lungs: Normal work of breathing. Neurologic: No  focal deficits.   Lab Results  Component Value Date   CREATININE 0.68 06/20/2020   BUN 10 06/20/2020   NA 136 06/20/2020   K 4.5 06/20/2020   CL 101 06/20/2020   CO2 19 (L) 06/20/2020   Lab Results  Component Value Date   ALT 15 06/20/2020   AST 18 06/20/2020   ALKPHOS 57 06/20/2020   BILITOT 0.5 06/20/2020   Lab Results  Component Value Date   HGBA1C 5.5 06/20/2020   Lab Results  Component Value Date   INSULIN 8.0 06/20/2020   Lab Results  Component Value Date   TSH 1.560 06/20/2020   Lab Results  Component Value Date   CHOL 192 06/20/2020   HDL 58 06/20/2020   LDLCALC 122 (H) 06/20/2020   TRIG 63 06/20/2020   CHOLHDL 3.3 06/20/2020   Lab Results  Component Value Date   VD25OH 21.4 (L) 06/20/2020   Lab Results  Component Value Date   WBC 5.1 06/20/2020   HGB 13.9 06/20/2020   HCT 43.7 06/20/2020   MCV 88 06/20/2020   PLT 244 06/20/2020   Lab Results  Component Value Date   IRON 84 06/20/2020   TIBC 389 06/20/2020   FERRITIN 34 06/20/2020    Attestation Statements:   Reviewed by clinician on day of visit: allergies, medications, problem list, medical history, surgical history, family history, social history, and previous encounter notes.  Coral Ceo, CMA, am acting as transcriptionist for Coralie Common, MD.   I have reviewed the above documentation for accuracy and completeness, and I agree with the above. - Coralie Common, MD

## 2020-10-31 ENCOUNTER — Encounter (INDEPENDENT_AMBULATORY_CARE_PROVIDER_SITE_OTHER): Payer: Self-pay | Admitting: Adult Health

## 2020-10-31 ENCOUNTER — Other Ambulatory Visit: Payer: Self-pay

## 2020-10-31 ENCOUNTER — Ambulatory Visit (INDEPENDENT_AMBULATORY_CARE_PROVIDER_SITE_OTHER): Payer: No Typology Code available for payment source | Admitting: Adult Health

## 2020-10-31 VITALS — BP 113/76 | HR 87 | Temp 98.2°F | Ht 64.0 in | Wt 215.0 lb

## 2020-10-31 DIAGNOSIS — E8881 Metabolic syndrome: Secondary | ICD-10-CM

## 2020-10-31 DIAGNOSIS — Z6837 Body mass index (BMI) 37.0-37.9, adult: Secondary | ICD-10-CM | POA: Diagnosis not present

## 2020-10-31 DIAGNOSIS — Z9189 Other specified personal risk factors, not elsewhere classified: Secondary | ICD-10-CM

## 2020-10-31 DIAGNOSIS — E559 Vitamin D deficiency, unspecified: Secondary | ICD-10-CM | POA: Diagnosis not present

## 2020-11-01 ENCOUNTER — Encounter (INDEPENDENT_AMBULATORY_CARE_PROVIDER_SITE_OTHER): Payer: Self-pay | Admitting: Adult Health

## 2020-11-01 ENCOUNTER — Other Ambulatory Visit (HOSPITAL_COMMUNITY): Payer: Self-pay

## 2020-11-01 LAB — VITAMIN D 25 HYDROXY (VIT D DEFICIENCY, FRACTURES): Vit D, 25-Hydroxy: 42 ng/mL (ref 30.0–100.0)

## 2020-11-01 MED ORDER — VITAMIN D (ERGOCALCIFEROL) 1.25 MG (50000 UNIT) PO CAPS
50000.0000 [IU] | ORAL_CAPSULE | ORAL | 0 refills | Status: DC
  Filled 2020-11-01: qty 4, 28d supply, fill #0

## 2020-11-01 NOTE — Progress Notes (Signed)
Chief Complaint:   OBESITY Jessica Levine is here to discuss her progress with her obesity treatment plan along with follow-up of her obesity related diagnoses. Jessica Levine is on the Category 2 Plan and keeping a food journal and adhering to recommended goals of 1200-1400 calories and 85+ grams of protein and states she is following her eating plan approximately 65% of the time. Jessica Levine states she is walking for 45 minutes 3 times per week.  Today's visit was #: 7 Starting weight: 218 lbs Starting date: 06/20/2020 Today's weight: 215 lbs Today's date: 10/31/2020 Total lbs lost to date: 3 lbs Total lbs lost since last in-office visit: 2 lbs  Interim History: Jessica Levine reports that journaling is too time intensive and she prefers the Category 2 meal plan due to simplicity. She feels that she consumes similar meals and snacks each day.   Subjective:   1. Vitamin D deficiency On 06/20/2020, vitamin D level was 21.4 - well below goal of 50.   She is currently taking prescription ergocalciferol 50,000 IU each week. She denies nausea, vomiting or muscle weakness.  Lab Results  Component Value Date   VD25OH 21.4 (L) 06/20/2020   2. Insulin resistance On 06/20/2020, BG 81, A2c 5.5 - at goal. Insulin slightly elevated at 8.0. Family history of diabetes - maternal grandmother.  She has never been on any BG lowering agents.  She denies polyphagia.  Lab Results  Component Value Date   INSULIN 8.0 06/20/2020   Lab Results  Component Value Date   HGBA1C 5.5 06/20/2020   3. At risk for side effect of medication Jessica Levine is at risk for medication side effect due to once weekly Ergocalciferol.  Assessment/Plan:   1. Vitamin D deficiency Check labs today. Refill ergocalciferol after lab result.  - VITAMIN D 25 Hydroxy (Vit-D Deficiency, Fractures)  2. Insulin resistance Check labs at next office visit.  3. At risk for side effect of medication Jessica Levine was given approximately 15 minutes of drug  side effect counseling today.  We discussed side effect possibility and risk versus benefits. Jessica Levine agreed to the medication and will contact this office if these side effects are intolerable.  Repetitive spaced learning was employed today to elicit superior memory formation and behavioral change.   4. Obesity with current BMI of 37.0  Jessica Levine is currently in the action stage of change. As such, her goal is to continue with weight loss efforts. She has agreed to the Category 2 Plan/track for journaling.   Exercise goals:  As is.  Behavioral modification strategies: increasing lean protein intake, decreasing simple carbohydrates, meal planning and cooking strategies, keeping healthy foods in the home, planning for success, and keeping a strict food journal.  Check fasting labs.  Hybrid of plan - Category 2/track for journaling.  Jessica Levine has agreed to follow-up with our clinic in 3 weeks. She was informed of the importance of frequent follow-up visits to maximize her success with intensive lifestyle modifications for her multiple health conditions.   Jessica Levine was informed we would discuss her lab results at her next visit unless there is a critical issue that needs to be addressed sooner. Jessica Levine agreed to keep her next visit at the agreed upon time to discuss these results.  Objective:   Blood pressure 113/76, pulse 87, temperature 98.2 F (36.8 C), height '5\' 4"'$  (1.626 m), weight 215 lb (97.5 kg), SpO2 97 %, unknown if currently breastfeeding. Body mass index is 36.9 kg/m.  General: Cooperative, alert, well developed, in  no acute distress. HEENT: Conjunctivae and lids unremarkable. Cardiovascular: Regular rhythm.  Lungs: Normal work of breathing. Neurologic: No focal deficits.   Lab Results  Component Value Date   CREATININE 0.68 06/20/2020   BUN 10 06/20/2020   NA 136 06/20/2020   K 4.5 06/20/2020   CL 101 06/20/2020   CO2 19 (L) 06/20/2020   Lab Results  Component Value Date    ALT 15 06/20/2020   AST 18 06/20/2020   ALKPHOS 57 06/20/2020   BILITOT 0.5 06/20/2020   Lab Results  Component Value Date   HGBA1C 5.5 06/20/2020   Lab Results  Component Value Date   INSULIN 8.0 06/20/2020   Lab Results  Component Value Date   TSH 1.560 06/20/2020   Lab Results  Component Value Date   CHOL 192 06/20/2020   HDL 58 06/20/2020   LDLCALC 122 (H) 06/20/2020   TRIG 63 06/20/2020   CHOLHDL 3.3 06/20/2020   Lab Results  Component Value Date   VD25OH 21.4 (L) 06/20/2020   Lab Results  Component Value Date   WBC 5.1 06/20/2020   HGB 13.9 06/20/2020   HCT 43.7 06/20/2020   MCV 88 06/20/2020   PLT 244 06/20/2020   Lab Results  Component Value Date   IRON 84 06/20/2020   TIBC 389 06/20/2020   FERRITIN 34 06/20/2020   Attestation Statements:   Reviewed by clinician on day of visit: allergies, medications, problem list, medical history, surgical history, family history, social history, and previous encounter notes.  I, Water quality scientist, CMA, am acting as Location manager for Mina Marble, NP.  I have reviewed the above documentation for accuracy and completeness, and I agree with the above. -  Taina Landry d. Jessica Hallberg, NP-C

## 2020-11-22 ENCOUNTER — Ambulatory Visit (INDEPENDENT_AMBULATORY_CARE_PROVIDER_SITE_OTHER): Payer: No Typology Code available for payment source | Admitting: Adult Health

## 2020-11-22 ENCOUNTER — Other Ambulatory Visit: Payer: Self-pay

## 2020-11-22 ENCOUNTER — Encounter (INDEPENDENT_AMBULATORY_CARE_PROVIDER_SITE_OTHER): Payer: Self-pay | Admitting: Adult Health

## 2020-11-22 VITALS — BP 105/71 | HR 79 | Temp 97.9°F | Ht 64.0 in | Wt 214.0 lb

## 2020-11-22 DIAGNOSIS — E7849 Other hyperlipidemia: Secondary | ICD-10-CM

## 2020-11-22 DIAGNOSIS — Z6837 Body mass index (BMI) 37.0-37.9, adult: Secondary | ICD-10-CM

## 2020-11-22 DIAGNOSIS — E559 Vitamin D deficiency, unspecified: Secondary | ICD-10-CM | POA: Diagnosis not present

## 2020-11-22 DIAGNOSIS — E8881 Metabolic syndrome: Secondary | ICD-10-CM

## 2020-11-22 DIAGNOSIS — Z9189 Other specified personal risk factors, not elsewhere classified: Secondary | ICD-10-CM

## 2020-11-22 NOTE — Progress Notes (Signed)
Chief Complaint:   OBESITY Jessica Levine is here to discuss her progress with her obesity treatment plan along with follow-up of her obesity related diagnoses. Jessica Levine is on the Category 2 Plan/track for journaling and states she is following her eating plan approximately 70% of the time. Jessica Levine states she is using the treadmill for 20 minutes 3 times per week.  Today's visit was #: 8 Starting weight: 218 lbs Starting date: 06/20/2020 Today's weight: 214 lbs Today's date: 11/22/2020 Total lbs lost to date: 4 lbs Total lbs lost since last in-office visit: 1 lb  Interim History: Jessica Levine has been skipping meals when busy at work.   She will try to choose higher protein snacks to bridge in between her meals.  Subjective:   1. Vitamin D deficiency Vitamin D level on 10/31/2020 - 42.0 - just below goal of 50. She is currently taking prescription vitamin D 50,000 IU each week. She denies nausea, vomiting or muscle weakness.  2. Insulin resistance She is not on metformin.  Denies polyphagia.  3. Hyperlipidemia, pure She is not on statin therapy. She denies acute cardiac sx's.  4. At risk for heart disease Dreama is at a higher than average risk for cardiovascular disease due to hyperlipidemia and obesity.    Assessment/Plan:   1. Vitamin D deficiency Check vitamin D level today. Low Vitamin D level contributes to fatigue and are associated with obesity, breast, and colon cancer. She agrees to continue to take prescription Vitamin D @50 ,000 IU every week and will follow-up for routine testing of Vitamin D, at least 2-3 times per year to avoid over-replacement.  - VITAMIN D 25 Hydroxy (Vit-D Deficiency, Fractures)  2. Insulin resistance Check labs today.  Jessica Levine will continue to work on weight loss, exercise, and decreasing simple carbohydrates to help decrease the risk of diabetes. Jessica Levine agreed to follow-up with [place] as directed to closely monitor her progress.  - Comprehensive metabolic  panel - Hemoglobin A1c - Insulin, random  3. Hyperlipidemia, pure Will check lipid panel today.  Cardiovascular risk and specific lipid/LDL goals reviewed.  We discussed several lifestyle modifications today and Jessica Levine will continue to work on diet, exercise and weight loss efforts. Orders and follow up as documented in patient record.   Counseling Intensive lifestyle modifications are the first line treatment for this issue. Dietary changes: Increase soluble fiber. Decrease simple carbohydrates. Exercise changes: Moderate to vigorous-intensity aerobic activity 150 minutes per week if tolerated. Lipid-lowering medications: see documented in medical record.  - Lipid panel  4. At risk for heart disease Jessica Levine was given approximately 15 minutes of coronary artery disease prevention counseling today. She is 40 y.o. female and has risk factors for heart disease including obesity. We discussed intensive lifestyle modifications today with an emphasis on specific weight loss instructions and strategies.   Repetitive spaced learning was employed today to elicit superior memory formation and behavioral change.   5. Obesity with current BMI of 36.8  Jessica Levine is currently in the action stage of change. As such, her goal is to continue with weight loss efforts. She has agreed to the Category 2 Plan.   Exercise goals:  As is.  Behavioral modification strategies: increasing lean protein intake, decreasing simple carbohydrates, no skipping meals, meal planning and cooking strategies, keeping healthy foods in the home, and planning for success.  Handout:  Additional Breakfast Options / Recipe Guide II.  Jessica Levine has agreed to follow-up with our clinic in 3 weeks. She was informed of the  importance of frequent follow-up visits to maximize her success with intensive lifestyle modifications for her multiple health conditions.   Jessica Levine was informed we would discuss her lab results at her next visit unless  there is a critical issue that needs to be addressed sooner. Jessica Levine agreed to keep her next visit at the agreed upon time to discuss these results.  Objective:   Blood pressure 105/71, pulse 79, temperature 97.9 F (36.6 C), height 5\' 4"  (1.626 m), weight 214 lb (97.1 kg), SpO2 98 %, unknown if currently breastfeeding. Body mass index is 36.73 kg/m.  General: Cooperative, alert, well developed, in no acute distress. HEENT: Conjunctivae and lids unremarkable. Cardiovascular: Regular rhythm.  Lungs: Normal work of breathing. Neurologic: No focal deficits.   Lab Results  Component Value Date   CREATININE 0.68 06/20/2020   BUN 10 06/20/2020   NA 136 06/20/2020   K 4.5 06/20/2020   CL 101 06/20/2020   CO2 19 (L) 06/20/2020   Lab Results  Component Value Date   ALT 15 06/20/2020   AST 18 06/20/2020   ALKPHOS 57 06/20/2020   BILITOT 0.5 06/20/2020   Lab Results  Component Value Date   HGBA1C 5.5 06/20/2020   Lab Results  Component Value Date   INSULIN 8.0 06/20/2020   Lab Results  Component Value Date   TSH 1.560 06/20/2020   Lab Results  Component Value Date   CHOL 192 06/20/2020   HDL 58 06/20/2020   LDLCALC 122 (H) 06/20/2020   TRIG 63 06/20/2020   CHOLHDL 3.3 06/20/2020   Lab Results  Component Value Date   VD25OH 42.0 10/31/2020   VD25OH 21.4 (L) 06/20/2020   Lab Results  Component Value Date   WBC 5.1 06/20/2020   HGB 13.9 06/20/2020   HCT 43.7 06/20/2020   MCV 88 06/20/2020   PLT 244 06/20/2020   Lab Results  Component Value Date   IRON 84 06/20/2020   TIBC 389 06/20/2020   FERRITIN 34 06/20/2020   Attestation Statements:   Reviewed by clinician on day of visit: allergies, medications, problem list, medical history, surgical history, family history, social history, and previous encounter notes.  I, Water quality scientist, CMA, am acting as Location manager for Mina Marble, NP.  I have reviewed the above documentation for accuracy and completeness, and  I agree with the above. -  Meer Reindl d. Shinichi Anguiano, NP-C

## 2020-11-23 ENCOUNTER — Other Ambulatory Visit (HOSPITAL_COMMUNITY): Payer: Self-pay

## 2020-11-23 LAB — COMPREHENSIVE METABOLIC PANEL
ALT: 18 IU/L (ref 0–32)
AST: 17 IU/L (ref 0–40)
Albumin/Globulin Ratio: 1.6 (ref 1.2–2.2)
Albumin: 4.2 g/dL (ref 3.8–4.8)
Alkaline Phosphatase: 56 IU/L (ref 44–121)
BUN/Creatinine Ratio: 17 (ref 9–23)
BUN: 12 mg/dL (ref 6–20)
Bilirubin Total: 0.3 mg/dL (ref 0.0–1.2)
CO2: 21 mmol/L (ref 20–29)
Calcium: 9 mg/dL (ref 8.7–10.2)
Chloride: 103 mmol/L (ref 96–106)
Creatinine, Ser: 0.7 mg/dL (ref 0.57–1.00)
Globulin, Total: 2.6 g/dL (ref 1.5–4.5)
Glucose: 94 mg/dL (ref 70–99)
Potassium: 4.4 mmol/L (ref 3.5–5.2)
Sodium: 139 mmol/L (ref 134–144)
Total Protein: 6.8 g/dL (ref 6.0–8.5)
eGFR: 114 mL/min/{1.73_m2} (ref >59)

## 2020-11-23 LAB — LIPID PANEL
Chol/HDL Ratio: 3.6 ratio (ref 0.0–4.4)
Cholesterol, Total: 169 mg/dL (ref 100–199)
HDL: 47 mg/dL (ref >39)
LDL Chol Calc (NIH): 110 mg/dL — ABNORMAL HIGH (ref 0–99)
Triglycerides: 63 mg/dL (ref 0–149)
VLDL Cholesterol Cal: 12 mg/dL (ref 5–40)

## 2020-11-23 LAB — VITAMIN D 25 HYDROXY (VIT D DEFICIENCY, FRACTURES): Vit D, 25-Hydroxy: 40.8 ng/mL (ref 30.0–100.0)

## 2020-11-23 LAB — HEMOGLOBIN A1C
Est. average glucose Bld gHb Est-mCnc: 111 mg/dL
Hgb A1c MFr Bld: 5.5 % (ref 4.8–5.6)

## 2020-11-23 LAB — INSULIN, RANDOM: INSULIN: 10.1 u[IU]/mL (ref 2.6–24.9)

## 2020-12-12 ENCOUNTER — Ambulatory Visit (INDEPENDENT_AMBULATORY_CARE_PROVIDER_SITE_OTHER): Payer: No Typology Code available for payment source | Admitting: Adult Health

## 2020-12-23 ENCOUNTER — Telehealth: Payer: No Typology Code available for payment source | Admitting: Emergency Medicine

## 2020-12-23 DIAGNOSIS — B001 Herpesviral vesicular dermatitis: Secondary | ICD-10-CM | POA: Diagnosis not present

## 2020-12-23 MED ORDER — VALACYCLOVIR HCL 1 G PO TABS: 2000.0000 mg | ORAL_TABLET | Freq: Two times a day (BID) | ORAL | 0 refills | Status: AC

## 2020-12-23 NOTE — Progress Notes (Signed)

## 2020-12-23 NOTE — Progress Notes (Signed)
I have spent 5 minutes in review of e-visit questionnaire, review and updating patient chart, medical decision making and response to patient.   Jamonte Curfman, PA-C    

## 2020-12-26 ENCOUNTER — Other Ambulatory Visit (HOSPITAL_COMMUNITY): Payer: Self-pay

## 2020-12-26 ENCOUNTER — Encounter (INDEPENDENT_AMBULATORY_CARE_PROVIDER_SITE_OTHER): Payer: Self-pay | Admitting: Adult Health

## 2020-12-26 ENCOUNTER — Other Ambulatory Visit: Payer: Self-pay

## 2020-12-26 ENCOUNTER — Ambulatory Visit (INDEPENDENT_AMBULATORY_CARE_PROVIDER_SITE_OTHER): Payer: No Typology Code available for payment source | Admitting: Adult Health

## 2020-12-26 ENCOUNTER — Telehealth: Payer: No Typology Code available for payment source | Admitting: Physician Assistant

## 2020-12-26 VITALS — BP 113/76 | HR 86 | Temp 98.5°F | Ht 64.0 in | Wt 210.0 lb

## 2020-12-26 DIAGNOSIS — Z9189 Other specified personal risk factors, not elsewhere classified: Secondary | ICD-10-CM | POA: Diagnosis not present

## 2020-12-26 DIAGNOSIS — Z6837 Body mass index (BMI) 37.0-37.9, adult: Secondary | ICD-10-CM

## 2020-12-26 DIAGNOSIS — E7849 Other hyperlipidemia: Secondary | ICD-10-CM

## 2020-12-26 DIAGNOSIS — E559 Vitamin D deficiency, unspecified: Secondary | ICD-10-CM

## 2020-12-26 DIAGNOSIS — E8881 Metabolic syndrome: Secondary | ICD-10-CM | POA: Diagnosis not present

## 2020-12-26 DIAGNOSIS — J208 Acute bronchitis due to other specified organisms: Secondary | ICD-10-CM

## 2020-12-26 MED ORDER — BENZONATATE 100 MG PO CAPS: 100.0000 mg | ORAL_CAPSULE | Freq: Three times a day (TID) | ORAL | 0 refills | Status: DC | PRN

## 2020-12-26 MED ORDER — VITAMIN D (ERGOCALCIFEROL) 1.25 MG (50000 UNIT) PO CAPS
50000.0000 [IU] | ORAL_CAPSULE | ORAL | 0 refills | Status: DC
Start: 2020-12-26 — End: 2021-01-23
  Filled 2020-12-26: qty 4, 28d supply, fill #0

## 2020-12-26 MED ORDER — AZITHROMYCIN 250 MG PO TABS: ORAL_TABLET | ORAL | 0 refills | Status: AC

## 2020-12-26 NOTE — Progress Notes (Signed)

## 2020-12-26 NOTE — Progress Notes (Signed)
I have spent 5 minutes in review of e-visit questionnaire, review and updating patient chart, medical decision making and response to patient.   Martice Doty Cody Ryleigh Esqueda, PA-C    

## 2020-12-27 NOTE — Progress Notes (Signed)
Chief Complaint:   OBESITY Jessica Levine is here to discuss her progress with her obesity treatment plan along with follow-up of her obesity related diagnoses. Jessica Levine is on the Category 2 Plan and states she is following her eating plan approximately 70% of the time. Jessica Levine states she is not exercising regularly at this time.  Today's visit was #: 9 Starting weight: 218 lbs Starting date: 06/20/2020 Today's weight: 210 lbs Today's date: 12/26/2020 Total lbs lost to date: 8 lbs Total lbs lost since last in-office visit: 4 lbs  Interim History: Jessica Levine says that when eating off plan, she craves sweets or fast foods. She gets in 5,000-8,000 steps when working.  She works 4 days per week, 9/10 hr shifts.  Subjective:   1. Vitamin D deficiency Discussed labs with patient today.  On 11/22/2020, vitamin D level  - 40.8 - below goal of 50. She is currently taking prescription ergocalciferol 50,000 IU each week. She denies nausea, vomiting or muscle weakness.  2. Hyperlipidemia, pure Discussed labs with patient today.  On 11/22/2020, lipid panel - levels stable with slightly elevated elevated LDL.  3. Insulin resistance Worsening.  Discussed labs with patient today.  On 11/22/2020, BG 94, A1c 5.5. Worsening insulin level 10.1. She reports late afternoon polyphagia.   Discussed risks/benefits of metformin therapy.  She declines at this time.  4. At risk for osteoporosis Jessica Levine is at higher risk of osteopenia and osteoporosis due to Vitamin D deficiency and obesity.    Assessment/Plan:   1. Vitamin D deficiency Refill ergocalciferol 50,000 IU once weekly, as per below.  - Refill Vitamin D, Ergocalciferol, (DRISDOL) 1.25 MG (50000 UNIT) CAPS capsule; Take 1 capsule (50,000 Units total) by mouth every 7 (seven) days.  Dispense: 4 capsule; Refill: 0  2. Hyperlipidemia, pure Decrease saturated fat, increase exercise.  3. Insulin resistance Increase protein, decrease carbohydrates/sugar,  increase exercise.  4. At risk for osteoporosis Jessica Levine was given approximately 15 minutes of osteoporosis prevention counseling today. Jessica Levine is at risk for osteopenia and osteoporosis due to her Vitamin D deficiency. She was encouraged to take her Vitamin D and follow her higher calcium diet and increase strengthening exercise to help strengthen her bones and decrease her risk of osteopenia and osteoporosis.  Repetitive spaced learning was employed today to elicit superior memory formation and behavioral change.  5. Obesity with current BMI of 36.2  Jessica Levine is currently in the action stage of change. As such, her goal is to continue with weight loss efforts. She has agreed to the Category 2 Plan.   Handout:  Metformin information sheet.  Exercise goals:  Increase daily walking.  Behavioral modification strategies: increasing lean protein intake, decreasing simple carbohydrates, meal planning and cooking strategies, keeping healthy foods in the home, and planning for success.  Jessica Levine has agreed to follow-up with our clinic in 3 weeks. She was informed of the importance of frequent follow-up visits to maximize her success with intensive lifestyle modifications for her multiple health conditions.   Objective:   Blood pressure 113/76, pulse 86, temperature 98.5 F (36.9 C), height 5\' 4"  (1.626 m), weight 210 lb (95.3 kg), SpO2 97 %, unknown if currently breastfeeding. Body mass index is 36.05 kg/m.  General: Cooperative, alert, well developed, in no acute distress. HEENT: Conjunctivae and lids unremarkable. Cardiovascular: Regular rhythm.  Lungs: Normal work of breathing. Neurologic: No focal deficits.   Lab Results  Component Value Date   CREATININE 0.70 11/22/2020   BUN 12 11/22/2020  NA 139 11/22/2020   K 4.4 11/22/2020   CL 103 11/22/2020   CO2 21 11/22/2020   Lab Results  Component Value Date   ALT 18 11/22/2020   AST 17 11/22/2020   ALKPHOS 56 11/22/2020   BILITOT 0.3  11/22/2020   Lab Results  Component Value Date   HGBA1C 5.5 11/22/2020   HGBA1C 5.5 06/20/2020   Lab Results  Component Value Date   INSULIN 10.1 11/22/2020   INSULIN 8.0 06/20/2020   Lab Results  Component Value Date   TSH 1.560 06/20/2020   Lab Results  Component Value Date   CHOL 169 11/22/2020   HDL 47 11/22/2020   LDLCALC 110 (H) 11/22/2020   TRIG 63 11/22/2020   CHOLHDL 3.6 11/22/2020   Lab Results  Component Value Date   VD25OH 40.8 11/22/2020   VD25OH 42.0 10/31/2020   VD25OH 21.4 (L) 06/20/2020   Lab Results  Component Value Date   WBC 5.1 06/20/2020   HGB 13.9 06/20/2020   HCT 43.7 06/20/2020   MCV 88 06/20/2020   PLT 244 06/20/2020   Lab Results  Component Value Date   IRON 84 06/20/2020   TIBC 389 06/20/2020   FERRITIN 34 06/20/2020   Attestation Statements:   Reviewed by clinician on day of visit: allergies, medications, problem list, medical history, surgical history, family history, social history, and previous encounter notes.  I, Water quality scientist, CMA, am acting as Location manager for Mina Marble, NP.  I have reviewed the above documentation for accuracy and completeness, and I agree with the above. -  Victoriana Aziz d. Arlone Lenhardt, NP-C

## 2021-01-23 ENCOUNTER — Other Ambulatory Visit: Payer: Self-pay

## 2021-01-23 ENCOUNTER — Other Ambulatory Visit (HOSPITAL_COMMUNITY): Payer: Self-pay

## 2021-01-23 ENCOUNTER — Ambulatory Visit (INDEPENDENT_AMBULATORY_CARE_PROVIDER_SITE_OTHER): Payer: No Typology Code available for payment source | Admitting: Adult Health

## 2021-01-23 ENCOUNTER — Encounter (INDEPENDENT_AMBULATORY_CARE_PROVIDER_SITE_OTHER): Payer: Self-pay | Admitting: Adult Health

## 2021-01-23 VITALS — BP 103/70 | HR 75 | Temp 98.0°F | Ht 64.0 in | Wt 211.0 lb

## 2021-01-23 DIAGNOSIS — E8881 Metabolic syndrome: Secondary | ICD-10-CM

## 2021-01-23 DIAGNOSIS — Z9189 Other specified personal risk factors, not elsewhere classified: Secondary | ICD-10-CM | POA: Diagnosis not present

## 2021-01-23 DIAGNOSIS — E559 Vitamin D deficiency, unspecified: Secondary | ICD-10-CM | POA: Diagnosis not present

## 2021-01-23 DIAGNOSIS — Z6837 Body mass index (BMI) 37.0-37.9, adult: Secondary | ICD-10-CM | POA: Diagnosis not present

## 2021-01-23 MED ORDER — VITAMIN D (ERGOCALCIFEROL) 1.25 MG (50000 UNIT) PO CAPS
50000.0000 [IU] | ORAL_CAPSULE | ORAL | 0 refills | Status: DC
  Filled 2021-01-23: qty 4, 28d supply, fill #0

## 2021-01-23 MED ORDER — METFORMIN HCL 500 MG PO TABS
ORAL_TABLET | ORAL | 0 refills | Status: DC
  Filled 2021-01-23: qty 30, 30d supply, fill #0

## 2021-01-24 NOTE — Progress Notes (Signed)
Chief Complaint:   OBESITY Jessica Levine is here to discuss her progress with her obesity treatment plan along with follow-up of her obesity related diagnoses. Jessica Levine is on the Category 2 Plan and states she is following her eating plan approximately 60% of the time. Jessica Levine states she is not exercising regularly.  Today's visit was #: 10 Starting weight: 218 lbs Starting date: 06/20/2020 Today's weight: 211 lbs Today's date: 01/23/2021 Total lbs lost to date: 7 lbs Total lbs lost since last in-office visit: 0  Interim History:  Jessica Levine reports historical/previous holiday season weight gain. She is please that she is only up 1 lb since last OV. Upcoming events/celebrations: Her daughter's 16th birthday, then her birthday, then Christmas.  Subjective:   1. Vitamin D deficiency On 11/22/2020, vitamin D level - 40.8 - below goal of 50. She is currently taking prescription ergocalciferol 50,000 IU each week. She denies nausea, vomiting or muscle weakness.  2. Insulin resistance Insulin level elevated at recheck, despite lifestyle modification. On 11/22/2020, CMP with GFR of >60. She has thought about metformin and is agreeable to start today.  3. At risk for diarrhea Jessica Levine is at risk for diarrhea due to starting metformin for IR.  Assessment/Plan:   1. Vitamin D deficiency Check labs in 2-3 months. Refill ergocalciferol 50,000 IU once weekly, as per below.  - Refill Vitamin D, Ergocalciferol, (DRISDOL) 1.25 MG (50000 UNIT) CAPS capsule; Take 1 capsule (50,000 Units total) by mouth every 7 (seven) days.  Dispense: 4 capsule; Refill: 0  2. Insulin resistance Start metformin 500 mg 1/2 tablet with lunch for 1 week, then increase to 1 full tablet with lunch and hold at this dose.  - Start metFORMIN (GLUCOPHAGE) 500 MG tablet; Take 0.5 tablets (250 mg total) by mouth daily with lunch for 7 days, THEN 1 tablet (500 mg total) daily with lunch.  Dispense: 30 tablet; Refill: 0  3. At  risk for diarrhea Jessica Levine was given approximately 15 minutes of diarrhea prevention counseling today. She is 40 y.o. female and has risk factors for diarrhea including medications and changes in diet. We discussed intensive lifestyle modifications today with an emphasis on specific weight loss instructions including dietary strategies.   Repetitive spaced learning was employed today to elicit superior memory formation and behavioral change.   4. Obesity with current BMI of 36.3  Jessica Levine is currently in the action stage of change. As such, her goal is to continue with weight loss efforts. She has agreed to the Category 2 Plan.   Exercise goals: No exercise has been prescribed at this time.  Behavioral modification strategies: increasing lean protein intake, decreasing simple carbohydrates, meal planning and cooking strategies, keeping healthy foods in the home, celebration eating strategies, and planning for success.  Jessica Levine has agreed to follow-up with our clinic in 3 weeks. She was informed of the importance of frequent follow-up visits to maximize her success with intensive lifestyle modifications for her multiple health conditions.   Objective:   Blood pressure 103/70, pulse 75, temperature 98 F (36.7 C), height 5\' 4"  (1.626 m), weight 211 lb (95.7 kg), SpO2 97 %, unknown if currently breastfeeding. Body mass index is 36.22 kg/m.  General: Cooperative, alert, well developed, in no acute distress. HEENT: Conjunctivae and lids unremarkable. Cardiovascular: Regular rhythm.  Lungs: Normal work of breathing. Neurologic: No focal deficits.   Lab Results  Component Value Date   CREATININE 0.70 11/22/2020   BUN 12 11/22/2020   NA 139 11/22/2020  K 4.4 11/22/2020   CL 103 11/22/2020   CO2 21 11/22/2020   Lab Results  Component Value Date   ALT 18 11/22/2020   AST 17 11/22/2020   ALKPHOS 56 11/22/2020   BILITOT 0.3 11/22/2020   Lab Results  Component Value Date   HGBA1C 5.5  11/22/2020   HGBA1C 5.5 06/20/2020   Lab Results  Component Value Date   INSULIN 10.1 11/22/2020   INSULIN 8.0 06/20/2020   Lab Results  Component Value Date   TSH 1.560 06/20/2020   Lab Results  Component Value Date   CHOL 169 11/22/2020   HDL 47 11/22/2020   LDLCALC 110 (H) 11/22/2020   TRIG 63 11/22/2020   CHOLHDL 3.6 11/22/2020   Lab Results  Component Value Date   VD25OH 40.8 11/22/2020   VD25OH 42.0 10/31/2020   VD25OH 21.4 (L) 06/20/2020   Lab Results  Component Value Date   WBC 5.1 06/20/2020   HGB 13.9 06/20/2020   HCT 43.7 06/20/2020   MCV 88 06/20/2020   PLT 244 06/20/2020   Lab Results  Component Value Date   IRON 84 06/20/2020   TIBC 389 06/20/2020   FERRITIN 34 06/20/2020   Attestation Statements:   Reviewed by clinician on day of visit: allergies, medications, problem list, medical history, surgical history, family history, social history, and previous encounter notes.  I, Water quality scientist, CMA, am acting as Location manager for Mina Marble, NP.  I have reviewed the above documentation for accuracy and completeness, and I agree with the above. -  Bryana Froemming d. Nathen Balaban, NP-C

## 2021-02-12 ENCOUNTER — Other Ambulatory Visit (HOSPITAL_COMMUNITY): Payer: Self-pay | Admitting: Obstetrics & Gynecology

## 2021-02-12 DIAGNOSIS — N6311 Unspecified lump in the right breast, upper outer quadrant: Secondary | ICD-10-CM

## 2021-02-13 ENCOUNTER — Ambulatory Visit (INDEPENDENT_AMBULATORY_CARE_PROVIDER_SITE_OTHER): Payer: No Typology Code available for payment source | Admitting: Adult Health

## 2021-02-13 ENCOUNTER — Other Ambulatory Visit: Payer: Self-pay

## 2021-02-13 ENCOUNTER — Ambulatory Visit (HOSPITAL_COMMUNITY)
Admission: RE | Admit: 2021-02-13 | Discharge: 2021-02-13 | Disposition: A | Discharge status: Home / Self Care | Payer: No Typology Code available for payment source | Source: Ambulatory Visit | Attending: Obstetrics & Gynecology | Admitting: Obstetrics & Gynecology

## 2021-02-13 DIAGNOSIS — N6311 Unspecified lump in the right breast, upper outer quadrant: Secondary | ICD-10-CM

## 2021-03-06 ENCOUNTER — Other Ambulatory Visit (HOSPITAL_COMMUNITY): Payer: Self-pay

## 2021-03-06 ENCOUNTER — Ambulatory Visit (INDEPENDENT_AMBULATORY_CARE_PROVIDER_SITE_OTHER): Payer: No Typology Code available for payment source | Admitting: Adult Health

## 2021-03-06 ENCOUNTER — Encounter (INDEPENDENT_AMBULATORY_CARE_PROVIDER_SITE_OTHER): Payer: Self-pay | Admitting: Adult Health

## 2021-03-06 ENCOUNTER — Other Ambulatory Visit: Payer: Self-pay

## 2021-03-06 VITALS — BP 110/72 | HR 84 | Temp 98.4°F | Ht 64.0 in | Wt 212.0 lb

## 2021-03-06 DIAGNOSIS — E669 Obesity, unspecified: Secondary | ICD-10-CM | POA: Diagnosis not present

## 2021-03-06 DIAGNOSIS — E559 Vitamin D deficiency, unspecified: Secondary | ICD-10-CM

## 2021-03-06 DIAGNOSIS — E88819 Insulin resistance, unspecified: Secondary | ICD-10-CM

## 2021-03-06 DIAGNOSIS — Z9189 Other specified personal risk factors, not elsewhere classified: Secondary | ICD-10-CM

## 2021-03-06 DIAGNOSIS — E8881 Metabolic syndrome: Secondary | ICD-10-CM

## 2021-03-06 DIAGNOSIS — Z6836 Body mass index (BMI) 36.0-36.9, adult: Secondary | ICD-10-CM | POA: Diagnosis not present

## 2021-03-06 DIAGNOSIS — Z6837 Body mass index (BMI) 37.0-37.9, adult: Secondary | ICD-10-CM

## 2021-03-06 MED ORDER — METFORMIN HCL 500 MG PO TABS
500.0000 mg | ORAL_TABLET | Freq: Every day | ORAL | 0 refills | Status: DC
  Filled 2021-03-06: qty 30, 30d supply, fill #0

## 2021-03-06 MED ORDER — VITAMIN D (ERGOCALCIFEROL) 1.25 MG (50000 UNIT) PO CAPS
50000.0000 [IU] | ORAL_CAPSULE | ORAL | 0 refills | Status: DC
  Filled 2021-03-06: qty 4, 28d supply, fill #0

## 2021-03-07 NOTE — Progress Notes (Signed)
Chief Complaint:   OBESITY Jessica Levine is here to discuss her progress with her obesity treatment plan along with follow-up of her obesity related diagnoses. Jessica Levine is on the Category 2 Plan and states she is following her eating plan approximately 40% of the time. Jessica Levine states she is walking for 20-30 minutes 2 times per week.  Today's visit was #: 11 Starting weight: 218 lbs Starting date: 06/20/2020 Today's weight: 212 lbs Today's date: 03/06/2021 Total lbs lost to date: 6 lbs Total lbs lost since last in-office visit: 0  Interim History:  Since last office visit, Jessica Levine has celebrated the holidays and gone to [employer] for a 5 day vacation. 2023 goals:  1) Weight loss. 2) Increased energy.  Subjective:   1. Vitamin D deficiency On 11/22/2020, vitamin D level - 40.8 - below goal of 50. She is currently taking prescription ergocalciferol 50,000 IU each week. She denies nausea, vomiting or muscle weakness.  2. Insulin resistance Recently started on metformin, has titrated up to full dose of 500 mg tablet.  Will often miss lunch dose.  3. At risk for diarrhea Nari is at higher risk of diarrhea due to metformin and obesity.  Assessment/Plan:   1. Vitamin D deficiency Check labs at next office visit. Refill ergocalciferol 50,000 IU once weekly.  - Refill Vitamin D, Ergocalciferol, (DRISDOL) 1.25 MG (50000 UNIT) CAPS capsule; Take 1 capsule (50,000 Units total) by mouth every 7 (seven) days.  Dispense: 4 capsule; Refill: 0  2. Insulin resistance Check labs at next office visit. Refill metformin 500 mg at lunch. Okay to change dose from lunch to dinner if that helps with medication compliance.  - Refill metFORMIN (GLUCOPHAGE) 500 MG tablet; Take 1 tablet (500 mg total) by mouth daily with lunch.  Dispense: 30 tablet; Refill: 0  3. At risk for diarrhea Jessica Levine was given approximately 15 minutes of diarrhea prevention counseling today. She is 41 y.o. female and has risk  factors for diarrhea including medications and changes in diet. We discussed intensive lifestyle modifications today with an emphasis on specific weight loss instructions including dietary strategies.   Repetitive spaced learning was employed today to elicit superior memory formation and behavioral change.  4. Obesity with current BMI of 36.4  Jessica Levine is currently in the action stage of change. As such, her goal is to continue with weight loss efforts. She has agreed to keeping a food journal and adhering to recommended goals of 1250 calories and 85 grams of protein.   Exercise goals:  As is.  Behavioral modification strategies: increasing lean protein intake, decreasing simple carbohydrates, meal planning and cooking strategies, keeping healthy foods in the home, and planning for success.  Jessica Levine has agreed to follow-up with our clinic in 3-4 weeks. She was informed of the importance of frequent follow-up visits to maximize her success with intensive lifestyle modifications for her multiple health conditions.   Objective:   Blood pressure 110/72, pulse 84, temperature 98.4 F (36.9 C), height 5\' 4"  (1.626 m), weight 212 lb (96.2 kg), SpO2 99 %, unknown if currently breastfeeding. Body mass index is 36.39 kg/m.  General: Cooperative, alert, well developed, in no acute distress. HEENT: Conjunctivae and lids unremarkable. Cardiovascular: Regular rhythm.  Lungs: Normal work of breathing. Neurologic: No focal deficits.   Lab Results  Component Value Date   CREATININE 0.70 11/22/2020   BUN 12 11/22/2020   NA 139 11/22/2020   K 4.4 11/22/2020   CL 103 11/22/2020   CO2  21 11/22/2020   Lab Results  Component Value Date   ALT 18 11/22/2020   AST 17 11/22/2020   ALKPHOS 56 11/22/2020   BILITOT 0.3 11/22/2020   Lab Results  Component Value Date   HGBA1C 5.5 11/22/2020   HGBA1C 5.5 06/20/2020   Lab Results  Component Value Date   INSULIN 10.1 11/22/2020   INSULIN 8.0 06/20/2020    Lab Results  Component Value Date   TSH 1.560 06/20/2020   Lab Results  Component Value Date   CHOL 169 11/22/2020   HDL 47 11/22/2020   LDLCALC 110 (H) 11/22/2020   TRIG 63 11/22/2020   CHOLHDL 3.6 11/22/2020   Lab Results  Component Value Date   VD25OH 40.8 11/22/2020   VD25OH 42.0 10/31/2020   VD25OH 21.4 (L) 06/20/2020   Lab Results  Component Value Date   WBC 5.1 06/20/2020   HGB 13.9 06/20/2020   HCT 43.7 06/20/2020   MCV 88 06/20/2020   PLT 244 06/20/2020   Lab Results  Component Value Date   IRON 84 06/20/2020   TIBC 389 06/20/2020   FERRITIN 34 06/20/2020   Attestation Statements:   Reviewed by clinician on day of visit: allergies, medications, problem list, medical history, surgical history, family history, social history, and previous encounter notes.  I, Water quality scientist, CMA, am acting as Location manager for Mina Marble, NP.  I have reviewed the above documentation for accuracy and completeness, and I agree with the above. -  Adalay Azucena d. Severino Paolo, NP-C

## 2021-03-29 ENCOUNTER — Ambulatory Visit: Payer: No Typology Code available for payment source | Admitting: Physician Assistant

## 2021-04-02 ENCOUNTER — Ambulatory Visit (INDEPENDENT_AMBULATORY_CARE_PROVIDER_SITE_OTHER): Payer: No Typology Code available for payment source | Admitting: Adult Health

## 2021-04-02 ENCOUNTER — Other Ambulatory Visit: Payer: Self-pay

## 2021-04-02 ENCOUNTER — Encounter (INDEPENDENT_AMBULATORY_CARE_PROVIDER_SITE_OTHER): Payer: Self-pay | Admitting: Adult Health

## 2021-04-02 VITALS — BP 122/75 | HR 85 | Temp 98.0°F | Ht 64.0 in | Wt 212.0 lb

## 2021-04-02 DIAGNOSIS — E8881 Metabolic syndrome: Secondary | ICD-10-CM

## 2021-04-02 DIAGNOSIS — Z6836 Body mass index (BMI) 36.0-36.9, adult: Secondary | ICD-10-CM

## 2021-04-02 DIAGNOSIS — E669 Obesity, unspecified: Secondary | ICD-10-CM

## 2021-04-02 DIAGNOSIS — E559 Vitamin D deficiency, unspecified: Secondary | ICD-10-CM

## 2021-04-02 DIAGNOSIS — Z6837 Body mass index (BMI) 37.0-37.9, adult: Secondary | ICD-10-CM

## 2021-04-02 MED ORDER — METFORMIN HCL 500 MG PO TABS: ORAL_TABLET | ORAL | 0 refills | Status: DC

## 2021-04-03 NOTE — Progress Notes (Signed)
Chief Complaint:   OBESITY Jessica Levine is here to discuss her progress with her obesity treatment plan along with follow-up of her obesity related diagnoses. Jessica Levine is on keeping a food journal and adhering to recommended goals of 1250 calories and 85 grams of protein and states she is following her eating plan approximately 60% of the time. Jessica Levine states she is walking for 20-30 minutes 3 times per week.  Today's visit was #: 12 Starting weight: 218 lbs Starting date: 06/20/2020 Today's weight: 212 lbs Today's date: 04/02/2021 Total lbs lost to date: 6 lbs Total lbs lost since last in-office visit: 0  Interim History:  Unable to complete fasting labs today- will obtain at next OV. Her father-in-law was diagnosed with pancreatic cancer on 14-Apr-2021.  He passed on 04-22-2021. Her kitchen is being renovated. These events have made eating on plan a challenge.  Subjective:   1. Insulin resistance On 11/22/2020, insulin level 10.1. She is on metformin 500 mg at lunch - then moved daily dose to dinner - profound GI upset - diarrhea.  Stopped for 14 days.   She resumed metformin 500 mg 1/2 tablet at lunch today.  2. Vitamin D deficiency On 11/22/2020, vitamin D level - 40.8. She is currently taking prescription ergocalciferol 50,000 IU each week. She denies nausea, vomiting or muscle weakness.  Assessment/Plan:   1. Insulin resistance Check labs at next office visit.  Continue 1/2 tablet metformin 500 mg daily.  - Refill metFORMIN (GLUCOPHAGE) 500 MG tablet; 1/2 tab once day with meal  Dispense: 30 tablet; Refill: 0  2. Vitamin D deficiency Check labs at next office visit.  3. Obesity with current BMI of 36.4  Jessica Levine is currently in the action stage of change. As such, her goal is to continue with weight loss efforts. She has agreed to keeping a food journal and adhering to recommended goals of 1250 calories and 85 grams of protein.   Check fasting labs at next office  visit.  [facility] [phone].  Exercise goals:  As is.  Behavioral modification strategies: increasing lean protein intake, decreasing simple carbohydrates, meal planning and cooking strategies, keeping healthy foods in the home, and planning for success.  Jessica Levine has agreed to follow-up with our clinic in 3-4 weeks, fasting. She was informed of the importance of frequent follow-up visits to maximize her success with intensive lifestyle modifications for her multiple health conditions.   Objective:   Blood pressure 122/75, pulse 85, temperature 98 F (36.7 C), height 5\' 4"  (1.626 m), weight 212 lb (96.2 kg), SpO2 97 %, unknown if currently breastfeeding. Body mass index is 36.39 kg/m.  General: Cooperative, alert, well developed, in no acute distress. HEENT: Conjunctivae and lids unremarkable. Cardiovascular: Regular rhythm.  Lungs: Normal work of breathing. Neurologic: No focal deficits.   Lab Results  Component Value Date   CREATININE 0.70 11/22/2020   BUN 12 11/22/2020   NA 139 11/22/2020   K 4.4 11/22/2020   CL 103 11/22/2020   CO2 21 11/22/2020   Lab Results  Component Value Date   ALT 18 11/22/2020   AST 17 11/22/2020   ALKPHOS 56 11/22/2020   BILITOT 0.3 11/22/2020   Lab Results  Component Value Date   HGBA1C 5.5 11/22/2020   HGBA1C 5.5 06/20/2020   Lab Results  Component Value Date   INSULIN 10.1 11/22/2020   INSULIN 8.0 06/20/2020   Lab Results  Component Value Date   TSH 1.560 06/20/2020   Lab Results  Component  Value Date   CHOL 169 11/22/2020   HDL 47 11/22/2020   LDLCALC 110 (H) 11/22/2020   TRIG 63 11/22/2020   CHOLHDL 3.6 11/22/2020   Lab Results  Component Value Date   VD25OH 40.8 11/22/2020   VD25OH 42.0 10/31/2020   VD25OH 21.4 (L) 06/20/2020   Lab Results  Component Value Date   WBC 5.1 06/20/2020   HGB 13.9 06/20/2020   HCT 43.7 06/20/2020   MCV 88 06/20/2020   PLT 244 06/20/2020   Lab Results  Component Value Date   IRON 84  06/20/2020   TIBC 389 06/20/2020   FERRITIN 34 06/20/2020   Attestation Statements:   Reviewed by clinician on day of visit: allergies, medications, problem list, medical history, surgical history, family history, social history, and previous encounter notes.  Time spent on visit including pre-visit chart review and post-visit care and charting was 28 minutes.   I, Water quality scientist, CMA, am acting as Location manager for Mina Marble, NP.  I have reviewed the above documentation for accuracy and completeness, and I agree with the above. -  Keerthana Vanrossum d. Korena Nass, NP-C

## 2021-04-04 ENCOUNTER — Other Ambulatory Visit (HOSPITAL_COMMUNITY): Payer: Self-pay

## 2021-05-01 ENCOUNTER — Telehealth: Payer: No Typology Code available for payment source | Admitting: Nurse Practitioner

## 2021-05-01 DIAGNOSIS — Z8619 Personal history of other infectious and parasitic diseases: Secondary | ICD-10-CM | POA: Diagnosis not present

## 2021-05-01 MED ORDER — ACYCLOVIR 800 MG PO TABS: 800.0000 mg | ORAL_TABLET | Freq: Two times a day (BID) | ORAL | 0 refills | Status: AC

## 2021-05-01 NOTE — Progress Notes (Signed)
We are sorry that you are not feeling well.  Here is how we plan to help! ? ?Based on what you have shared with me it does look like you have a viral infection.   ? ?Most cold sores or fever blisters are small fluid filled blisters around the mouth caused by herpes simplex virus.  The most common strain of the virus causing cold sores is herpes simplex virus 1.  It can be spread by skin contact, sharing eating utensils, or even sharing towels.  Cold sores are contagious to other people until dry. (Approximately 5-7 days).  Wash your hands. You can spread the virus to your eyes through handling your contact lenses after touching the lesions. ? ?Most people experience pain at the sight or tingling sensations in their lips that may begin before the ulcers erupt. ? ?Herpes simplex is treatable but not curable.  It may lie dormant for a long time and then reappear due to stress or prolonged sun exposure.  Many patients have success in treating their cold sores with an over the counter topical called Abreva.  You may apply the cream up to 5 times daily (maximum 10 days) until healing occurs. ? ?If you would like to use an oral antiviral medication to speed the healing of your cold sore, I have sent a prescription to your local pharmacy Acyclovir 800 mg take one by mouth twice a day for 7 days   ? ?HOME CARE: ? ?Wash your hands frequently. ?Do not pick at or rub the sore. ?Don't open the blisters. ?Avoid kissing other people during this time. ?Avoid sharing drinking glasses, eating utensils, or razors. ?Do not handle contact lenses unless you have thoroughly washed your hands with soap and warm water! ?Avoid oral sex during this time.  Herpes from sores on your mouth can spread to your partner's genital area. ?Avoid contact with anyone who has eczema or a weakened immune system. ?Cold sores are often triggered by exposure to intense sunlight, use a lip balm containing a sunscreen (SPF 30 or higher). ? ?GET HELP RIGHT AWAY  IF: ? ?Blisters look infected. ?Blisters occur near or in the eye. ?Symptoms last longer than 10 days. ?Your symptoms become worse. ? ?MAKE SURE YOU: ? ?Understand these instructions. ?Will watch your condition. ?Will get help right away if you are not doing well or get worse. ? ?  ?Your e-visit answers were reviewed by a board certified advanced clinical practitioner to complete your personal care plan.  Depending upon the condition, your plan could have  Included both over the counter or prescription medications.   ? ?Please review your pharmacy choice.  Be sure that the pharmacy you have chosen is open so that you can pick up your prescription now.  If there is a problem you can message your provider in [place] to have the prescription routed to another pharmacy.   ? ?Your safety is important to [place].  If you have drug allergies check our prescription carefully. ? ?For the next 24 hours you can use MyChart to ask questions about today's visit, request a non-urgent call back, or ask for a work or school excuse from your e-visit provider. ? ?You will get an email in the next two days asking about your experience.  I hope that your e-visit has been valuable and will speed your recovery.  ? ?I spent approximately 5 minutes reviewing the patient's history, current symptoms and coordinating their plan of care today.   ? ?Meds  ordered this encounter  ?Medications  ? acyclovir (ZOVIRAX) 800 MG tablet  ?  Sig: Take 1 tablet (800 mg total) by mouth 2 (two) times daily for 7 days.  ?  Dispense:  14 tablet  ?  Refill:  0  ?  ?

## 2021-05-02 ENCOUNTER — Ambulatory Visit (INDEPENDENT_AMBULATORY_CARE_PROVIDER_SITE_OTHER): Payer: No Typology Code available for payment source | Admitting: Adult Health

## 2021-05-03 ENCOUNTER — Encounter (INDEPENDENT_AMBULATORY_CARE_PROVIDER_SITE_OTHER): Payer: Self-pay

## 2021-07-05 ENCOUNTER — Other Ambulatory Visit (HOSPITAL_COMMUNITY): Payer: Self-pay | Admitting: Obstetrics & Gynecology

## 2021-07-19 ENCOUNTER — Other Ambulatory Visit (HOSPITAL_COMMUNITY): Payer: Self-pay | Admitting: Obstetrics & Gynecology

## 2021-07-19 DIAGNOSIS — Z09 Encounter for follow-up examination after completed treatment for conditions other than malignant neoplasm: Secondary | ICD-10-CM

## 2021-07-19 DIAGNOSIS — R928 Other abnormal and inconclusive findings on diagnostic imaging of breast: Secondary | ICD-10-CM

## 2021-07-26 ENCOUNTER — Encounter: Payer: Self-pay | Admitting: Physician Assistant

## 2021-07-26 ENCOUNTER — Other Ambulatory Visit (HOSPITAL_COMMUNITY): Payer: Self-pay

## 2021-07-26 ENCOUNTER — Ambulatory Visit (INDEPENDENT_AMBULATORY_CARE_PROVIDER_SITE_OTHER): Payer: No Typology Code available for payment source | Admitting: Physician Assistant

## 2021-07-26 DIAGNOSIS — L820 Inflamed seborrheic keratosis: Secondary | ICD-10-CM

## 2021-07-26 DIAGNOSIS — Z86018 Personal history of other benign neoplasm: Secondary | ICD-10-CM

## 2021-07-26 DIAGNOSIS — Z1283 Encounter for screening for malignant neoplasm of skin: Secondary | ICD-10-CM | POA: Diagnosis not present

## 2021-07-26 DIAGNOSIS — L700 Acne vulgaris: Secondary | ICD-10-CM

## 2021-07-26 DIAGNOSIS — L409 Psoriasis, unspecified: Secondary | ICD-10-CM | POA: Diagnosis not present

## 2021-07-26 DIAGNOSIS — D485 Neoplasm of uncertain behavior of skin: Secondary | ICD-10-CM

## 2021-07-26 MED ORDER — TRETINOIN 0.05 % EX CREA
1.0000 "application " | TOPICAL_CREAM | Freq: Every day | CUTANEOUS | 6 refills | Status: AC
  Filled 2021-07-26: qty 45, 30d supply, fill #0

## 2021-07-26 MED ORDER — CLOBETASOL PROPIONATE 0.05 % EX SOLN
1.0000 "application " | Freq: Two times a day (BID) | CUTANEOUS | 6 refills | Status: DC
  Filled 2021-07-26: qty 50, 25d supply, fill #0

## 2021-07-26 NOTE — Progress Notes (Signed)
   New Patient   Subjective  Jessica Levine is a 41 y.o. female who presents for the following: New Patient (Initial Visit) (Patient here today for skin check, per patient she has a few lesions around her bra area that itch that she would like checked x years. Per patient the lesions are larger and irritated, patient also has a dry patchy area on her posterior neck x years per patient she's used TAC in the past which helped. Personal history of atypical mole. No family history of atypical mole, melanoma or non mole skin cancer. ).   The following portions of the chart were reviewed this encounter and updated as appropriate:  Tobacco  Allergies  Meds  Problems  Med Hx  Surg Hx  Fam Hx      Objective  Well appearing patient in no apparent distress; mood and affect are within normal limits.  A full examination was performed including scalp, head, eyes, ears, nose, lips, neck, chest, axillae, abdomen, back, buttocks, bilateral upper extremities, bilateral lower extremities, hands, feet, fingers, toes, fingernails, and toenails. All findings within normal limits unless otherwise noted below.  Full body skin examination- No atypical nevi or signs of NMSC noted at the time of the visit.   Left Upper Back Dark crusted plaque. No measurement per [place]  Left Malar Cheek, Right Malar Cheek Erythematous papules and pustules with comedones   Left Dorsal Hand (3), Right Dorsal Hand (3) Stuck-on,brown plaques on an erythematous base.   Mid Occipital Scalp Well-marginated erythematous papules/plaques with silvery scale.    Assessment & Plan  Encounter for screening for malignant neoplasm of skin  Yearly skin examinations  Neoplasm of uncertain behavior of skin Left Upper Back  Skin / nail biopsy Type of biopsy: tangential   Informed consent: discussed and consent obtained   Timeout: patient name, date of birth, surgical site, and procedure verified   Procedure prep:  Patient was prepped  and draped in usual sterile fashion (Non sterile) Prep type:  Chlorhexidine Anesthesia: the lesion was anesthetized in a standard fashion   Anesthetic:  1% lidocaine w/ epinephrine 1-100,000 local infiltration Instrument used: flexible razor blade   Outcome: patient tolerated procedure well   Post-procedure details: wound care instructions given    Specimen 1 - Surgical pathology Differential Diagnosis:r/o sk  Check Margins: yes  Acne vulgaris Left Malar Cheek; Right Malar Cheek  tretinoin (RETIN-A) 0.05 % cream - Left Malar Cheek, Right Malar Cheek Apply 1 application topically to acne at bedtime  Seborrheic keratosis, inflamed (6) Left Dorsal Hand (3); Right Dorsal Hand (3)  Destruction of lesion - Left Dorsal Hand, Right Dorsal Hand Complexity: simple   Destruction method: cryotherapy   Informed consent: discussed and consent obtained   Timeout:  patient name, date of birth, surgical site, and procedure verified Lesion destroyed using liquid nitrogen: Yes   Cryotherapy cycles:  1 Outcome: patient tolerated procedure well with no complications   Post-procedure details: wound care instructions given    Psoriasis Mid Occipital Scalp  clobetasol (TEMOVATE) 0.05 % external solution - Mid Occipital Scalp Apply 1 application topically 2 (two) times daily.     I, Kristl Morioka, PA-C, have reviewed all documentation's for this visit.  The documentation on 07/26/21 for the exam, diagnosis, procedures and orders are all accurate and complete.

## 2021-07-26 NOTE — Patient Instructions (Signed)

## 2021-07-30 ENCOUNTER — Telehealth: Payer: Self-pay | Admitting: *Deleted

## 2021-07-30 NOTE — Telephone Encounter (Signed)
Prior authorization done via cover my meds for tretinoin cream.   [employer] is reviewing your PA request. You may close this dialog, return to your dashboard, and perform other tasks.  To check for an update later, open this request again from your dashboard. If [employer] has not replied within 24 hours for urgent requests or within 48 hours for standard requests, please contact [employer] at [phone].

## 2021-08-16 ENCOUNTER — Ambulatory Visit (HOSPITAL_COMMUNITY)
Admission: RE | Admit: 2021-08-16 | Discharge: 2021-08-16 | Disposition: A | Discharge status: Home / Self Care | Payer: No Typology Code available for payment source | Source: Ambulatory Visit | Attending: Obstetrics & Gynecology | Admitting: Obstetrics & Gynecology

## 2021-08-16 DIAGNOSIS — Z09 Encounter for follow-up examination after completed treatment for conditions other than malignant neoplasm: Secondary | ICD-10-CM | POA: Diagnosis present

## 2021-08-16 DIAGNOSIS — R928 Other abnormal and inconclusive findings on diagnostic imaging of breast: Secondary | ICD-10-CM | POA: Diagnosis present

## 2021-10-03 ENCOUNTER — Encounter (INDEPENDENT_AMBULATORY_CARE_PROVIDER_SITE_OTHER): Payer: Self-pay

## 2021-12-04 ENCOUNTER — Telehealth: Payer: No Typology Code available for payment source | Admitting: Physician Assistant

## 2021-12-04 DIAGNOSIS — B001 Herpesviral vesicular dermatitis: Secondary | ICD-10-CM

## 2021-12-04 MED ORDER — VALACYCLOVIR HCL 1 G PO TABS: 2000.0000 mg | ORAL_TABLET | Freq: Two times a day (BID) | ORAL | 0 refills | Status: AC

## 2021-12-04 NOTE — Progress Notes (Signed)

## 2021-12-04 NOTE — Progress Notes (Signed)
I have spent 5 minutes in review of e-visit questionnaire, review and updating patient chart, medical decision making and response to patient.   Leanndra Pember Cody Rayon Mcchristian, PA-C    

## 2022-02-04 ENCOUNTER — Other Ambulatory Visit (HOSPITAL_COMMUNITY): Payer: Self-pay | Admitting: Obstetrics & Gynecology

## 2022-02-04 DIAGNOSIS — Z09 Encounter for follow-up examination after completed treatment for conditions other than malignant neoplasm: Secondary | ICD-10-CM

## 2022-02-26 ENCOUNTER — Ambulatory Visit (HOSPITAL_COMMUNITY)
Admission: RE | Admit: 2022-02-26 | Discharge: 2022-02-26 | Disposition: A | Discharge status: Home / Self Care | Payer: 59 | Source: Ambulatory Visit | Attending: Obstetrics & Gynecology | Admitting: Obstetrics & Gynecology

## 2022-02-26 DIAGNOSIS — Z09 Encounter for follow-up examination after completed treatment for conditions other than malignant neoplasm: Secondary | ICD-10-CM

## 2022-02-26 DIAGNOSIS — R922 Inconclusive mammogram: Secondary | ICD-10-CM | POA: Diagnosis not present

## 2022-03-21 ENCOUNTER — Telehealth: Payer: 59 | Admitting: Physician Assistant

## 2022-03-21 DIAGNOSIS — B9689 Other specified bacterial agents as the cause of diseases classified elsewhere: Secondary | ICD-10-CM

## 2022-03-21 DIAGNOSIS — J019 Acute sinusitis, unspecified: Secondary | ICD-10-CM

## 2022-03-21 MED ORDER — AMOXICILLIN-POT CLAVULANATE 875-125 MG PO TABS: 1.0000 | ORAL_TABLET | Freq: Two times a day (BID) | ORAL | 0 refills | Status: DC

## 2022-03-21 NOTE — Progress Notes (Signed)

## 2022-03-21 NOTE — Progress Notes (Signed)
I have spent 5 minutes in review of e-visit questionnaire, review and updating patient chart, medical decision making and response to patient.   Saraia Platner Cody Georgeanne Frankland, PA-C    

## 2022-05-07 DIAGNOSIS — J0100 Acute maxillary sinusitis, unspecified: Secondary | ICD-10-CM | POA: Diagnosis not present

## 2022-05-07 DIAGNOSIS — Z6835 Body mass index (BMI) 35.0-35.9, adult: Secondary | ICD-10-CM | POA: Diagnosis not present

## 2022-05-07 DIAGNOSIS — R030 Elevated blood-pressure reading, without diagnosis of hypertension: Secondary | ICD-10-CM | POA: Diagnosis not present

## 2022-08-28 DIAGNOSIS — Z6835 Body mass index (BMI) 35.0-35.9, adult: Secondary | ICD-10-CM | POA: Diagnosis not present

## 2022-08-28 DIAGNOSIS — R030 Elevated blood-pressure reading, without diagnosis of hypertension: Secondary | ICD-10-CM | POA: Diagnosis not present

## 2022-08-28 DIAGNOSIS — J029 Acute pharyngitis, unspecified: Secondary | ICD-10-CM | POA: Diagnosis not present

## 2022-09-04 ENCOUNTER — Other Ambulatory Visit (HOSPITAL_COMMUNITY): Payer: Self-pay

## 2022-09-04 DIAGNOSIS — M79671 Pain in right foot: Secondary | ICD-10-CM | POA: Diagnosis not present

## 2022-09-04 MED ORDER — METHYLPREDNISOLONE 4 MG PO TBPK
ORAL_TABLET | ORAL | 0 refills | Status: DC
  Filled 2022-09-04 (×2): qty 21, 6d supply, fill #0

## 2022-09-05 ENCOUNTER — Encounter: Payer: Self-pay | Admitting: Pharmacist

## 2022-09-05 ENCOUNTER — Other Ambulatory Visit (HOSPITAL_COMMUNITY): Payer: Self-pay

## 2022-09-05 ENCOUNTER — Other Ambulatory Visit: Payer: Self-pay

## 2022-09-06 ENCOUNTER — Other Ambulatory Visit (HOSPITAL_COMMUNITY): Payer: Self-pay

## 2022-09-06 ENCOUNTER — Other Ambulatory Visit: Payer: Self-pay

## 2022-09-06 IMAGING — MG DIGITAL DIAGNOSTIC BILAT W/ TOMO W/ CAD
8 series · 8 of 24 positions shown · non-contrast
Comparison: Previous exams.

CLINICAL DATA: 32-year-old female presents with a palpable area of
concern felt by her provider reportedly the upper-outer quadrant of
the right breast.



[R CC synth-2D]
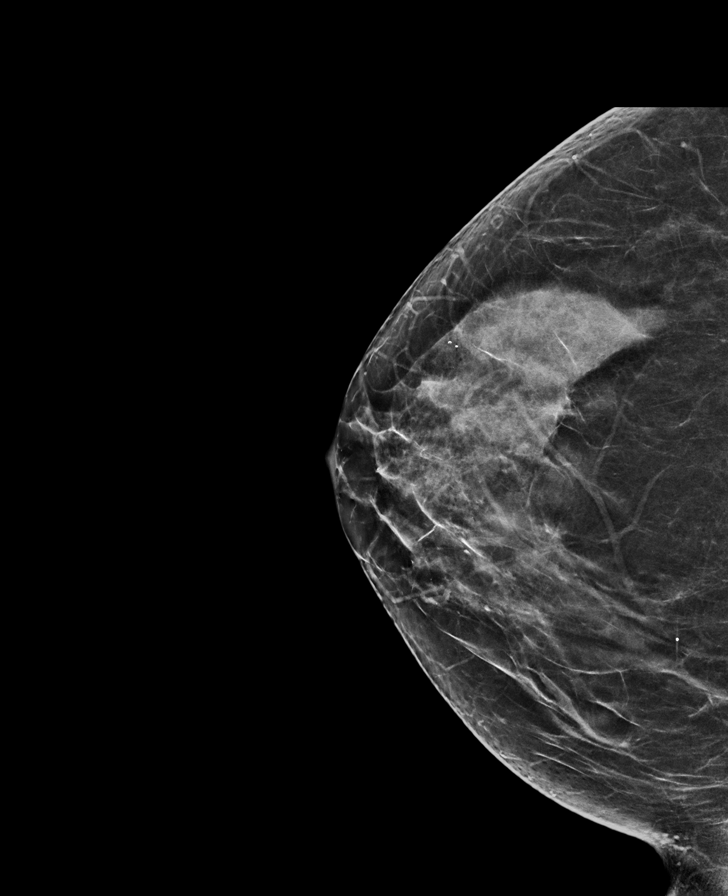

[L MLO synth-2D]
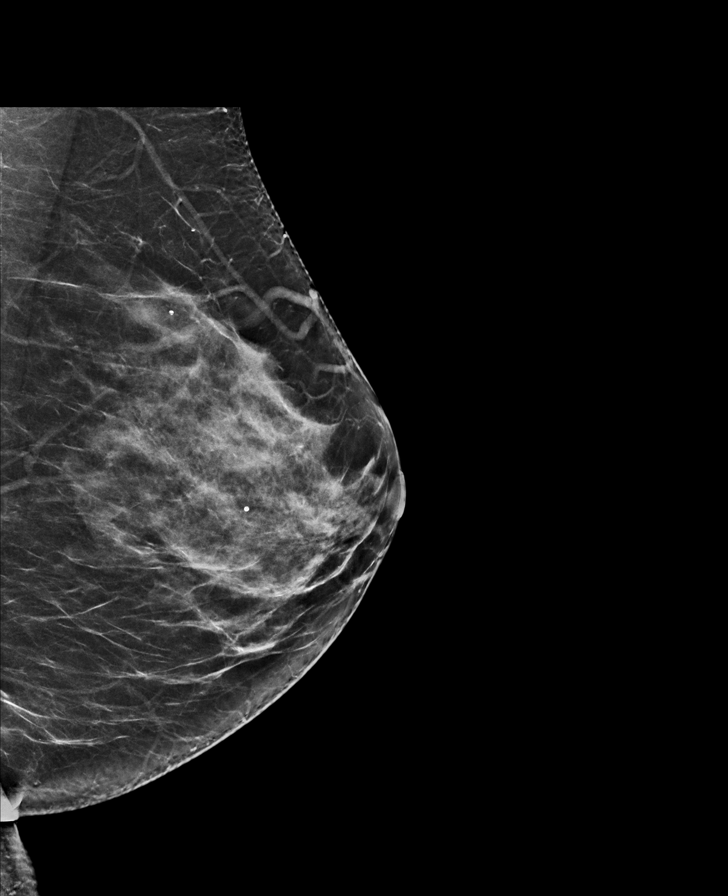

[R MLO synth-2D]
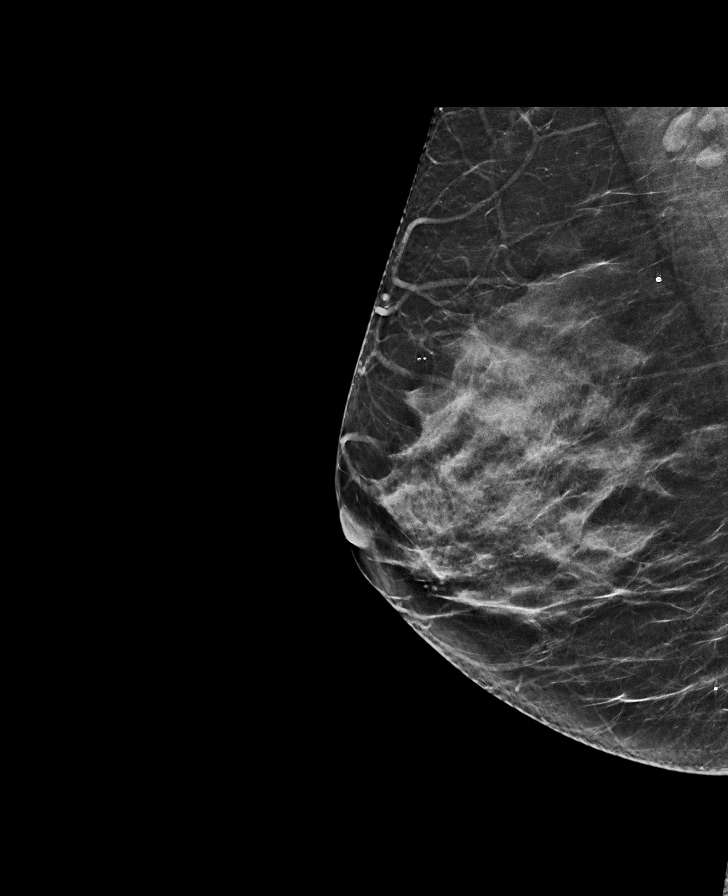

[L CC synth-2D]
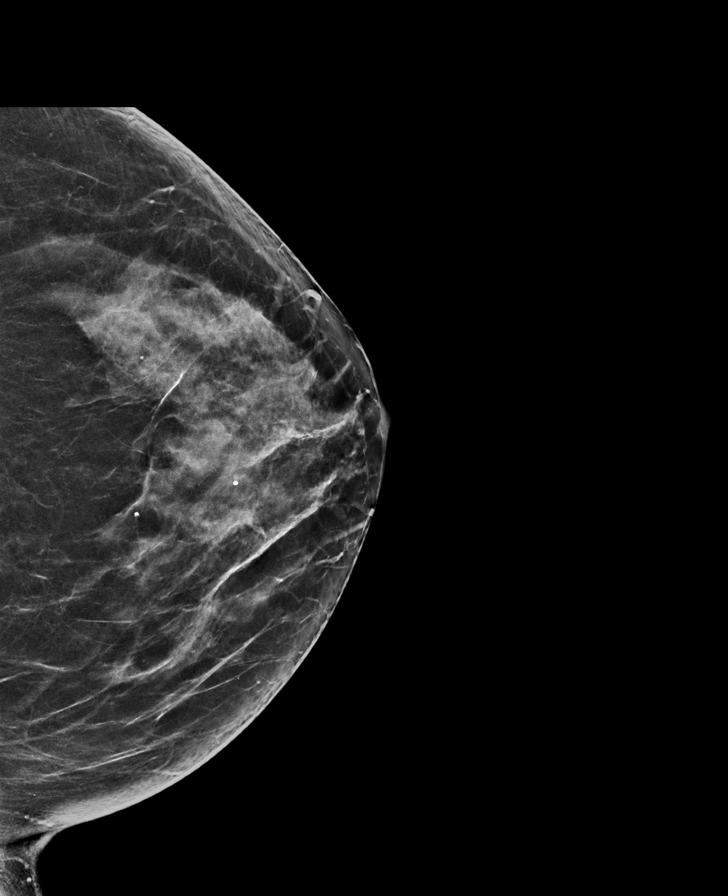

[L CC tomo · tomo slice 36/71.0]
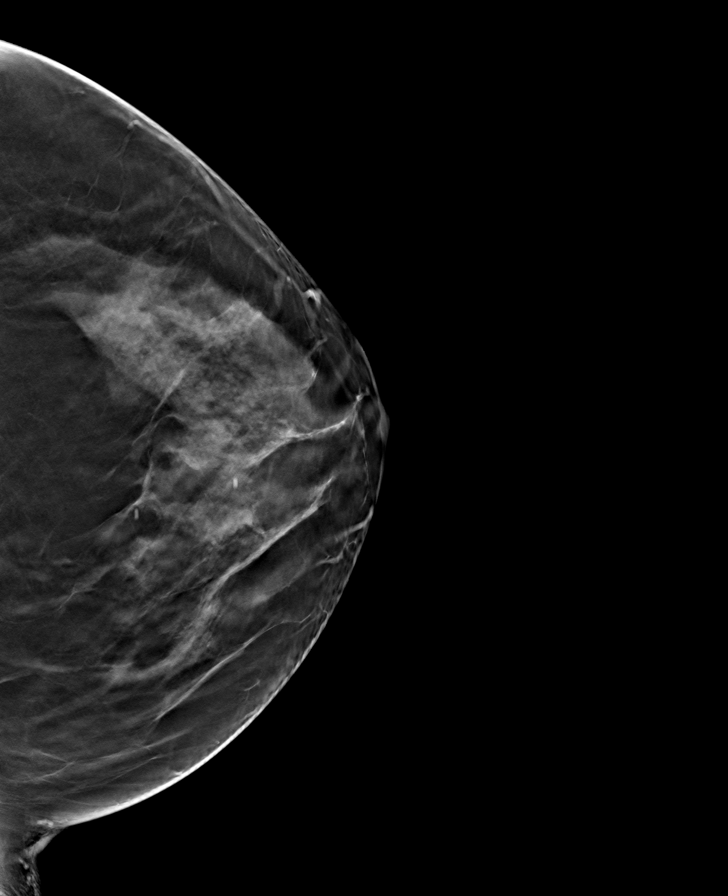

[R MLO tomo · tomo slice 34/67.0]
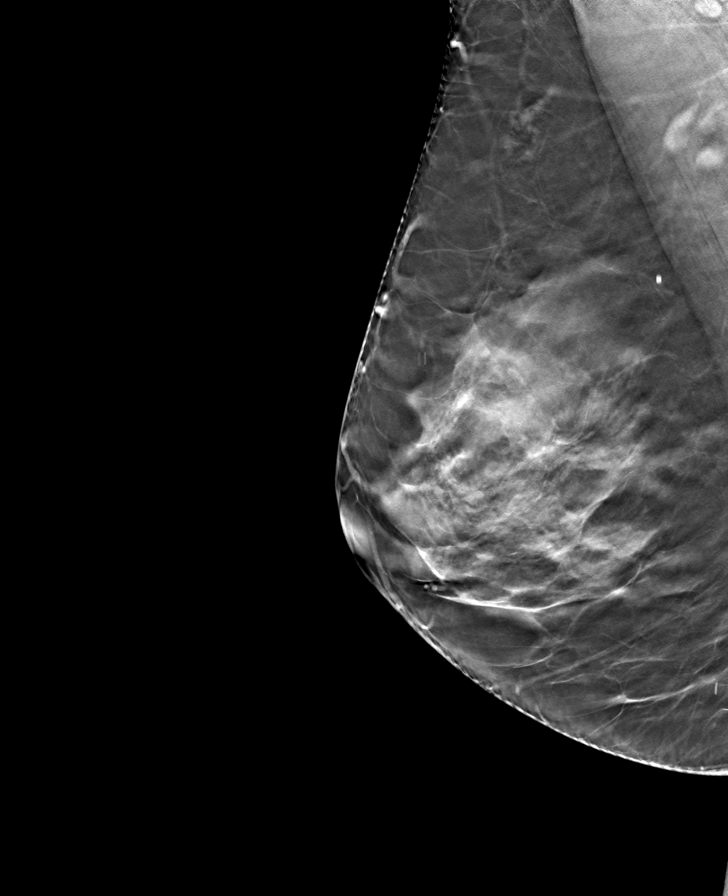

[R CC tomo · tomo slice 34/67.0]
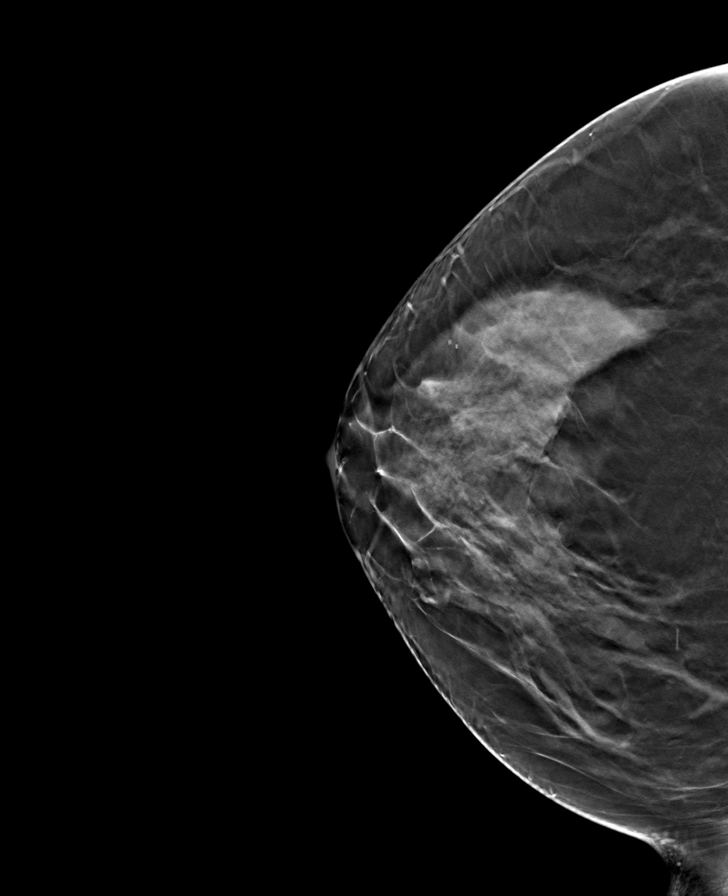

[L MLO tomo · tomo slice 35/70.0]
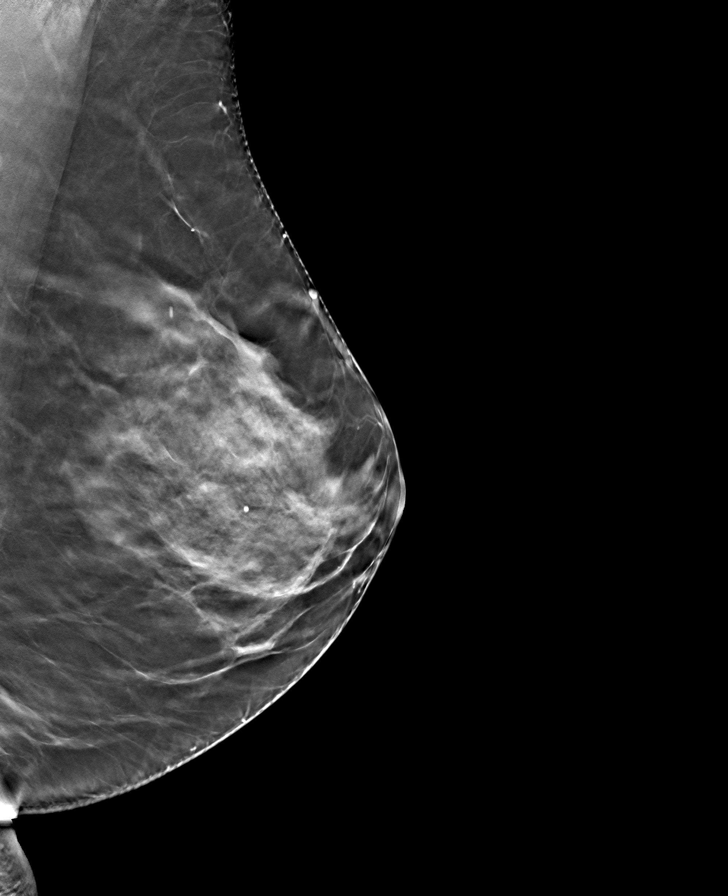

[8 of 24 positions shown; findings below may reference images not displayed]

ACR Breast Density Category c: The breast tissue is heterogeneously
dense, which may obscure small masses.
FINDINGS: No suspicious masses or calcifications seen in the right breast.
There is a focal area of dense fibroglandular tissue in the outer
right breast seen on the CC tomograms which disperses on the MLO
tomograms. There is an oval circumscribed mass measuring
approximately 0.8 cm visualized in the upper central far posterior
left breast.

Targeted ultrasound of the upper-outer quadrant of the right breast
was performed demonstrating extremely dense fibroglandular tissue.
No suspicious masses or abnormality seen in the upper-outer right
breast. In addition, sonographic evaluation of the inner right
breast also demonstrates similarly appearing dense fibroglandular
tissue.

Targeted ultrasound of the entire upper and central left breast was
performed demonstrating several scattered areas of fibrocystic
change. A cluster of complicated cysts in the left breast at 12
o'clock 1 cm from the nipple measures 0.6 x 0.5 x 0.8 cm. This
likely corresponds with the oval mass seen in the left breast at
mammography.
IMPRESSION: 1. Probably benign complicated cyst/cluster of cysts in the left
breast at the 12 position.

2. No mammographic or sonographic abnormalities at the sites of
concern in the right breast, only areas of extremely dense
fibroglandular tissue visualized.

RECOMMENDATION:
Recommend diagnostic mammography and ultrasound of the left breast
in 6 months.

I have discussed the findings and recommendations with the patient.
If applicable, a reminder letter will be sent to the patient
regarding the next appointment.

BI-RADS CATEGORY  3: Probably benign.

## 2022-09-06 IMAGING — US US BREAST*L* LIMITED INC AXILLA
1 series · 11 of 11 positions shown · non-contrast
Comparison: Previous exams.

CLINICAL DATA: 32-year-old female presents with a palpable area of
concern felt by her provider reportedly the upper-outer quadrant of
the right breast.



[Series 1: us breast*left* limited inc axilla · 0.07mm/px · 11 of 11 slices shown]
[im 1/11]
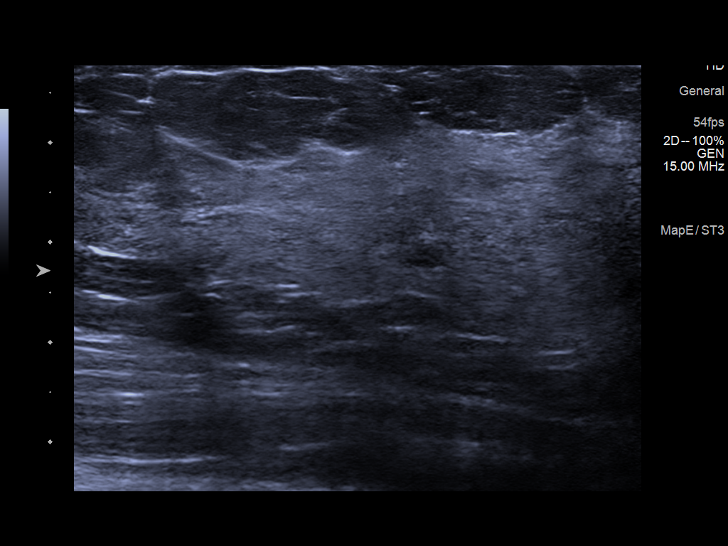
[im 2/11]
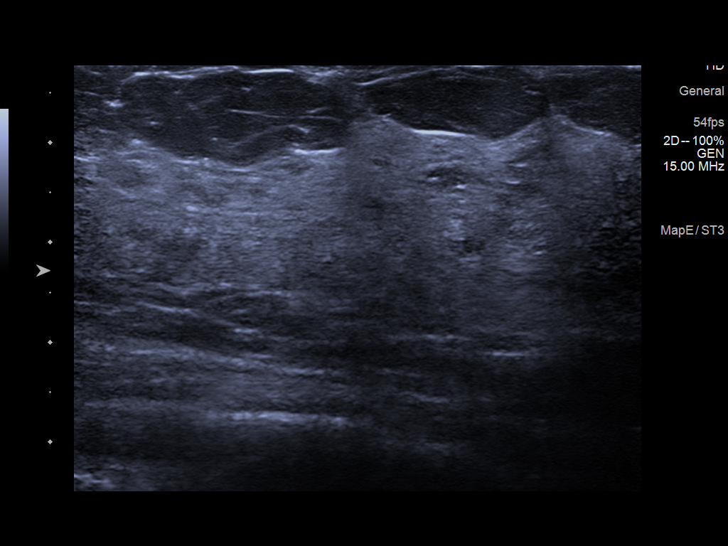
[im 3/11]
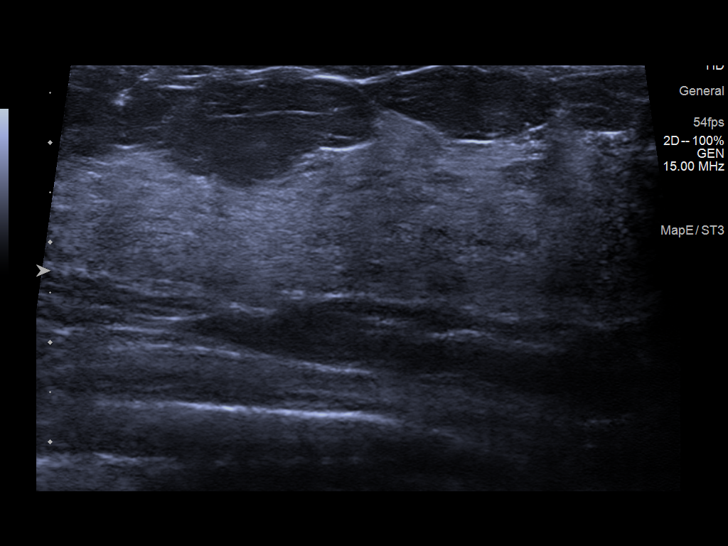
[im 4/11]
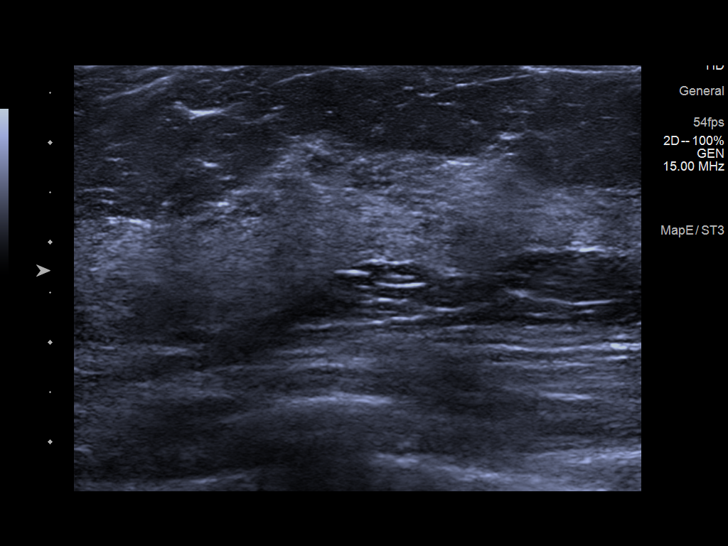
[im 5/11]
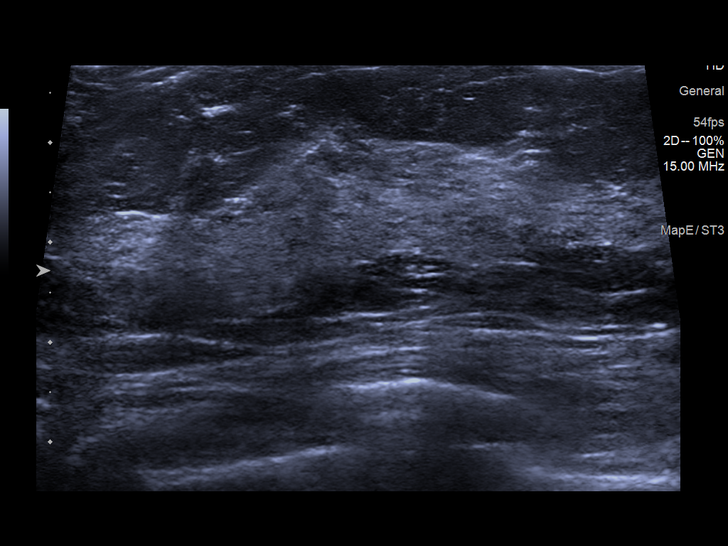
[im 6/11]
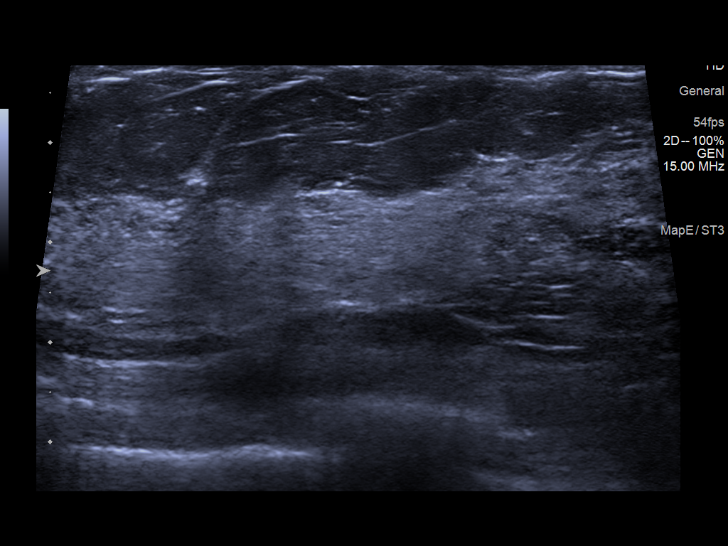
[im 7/11]
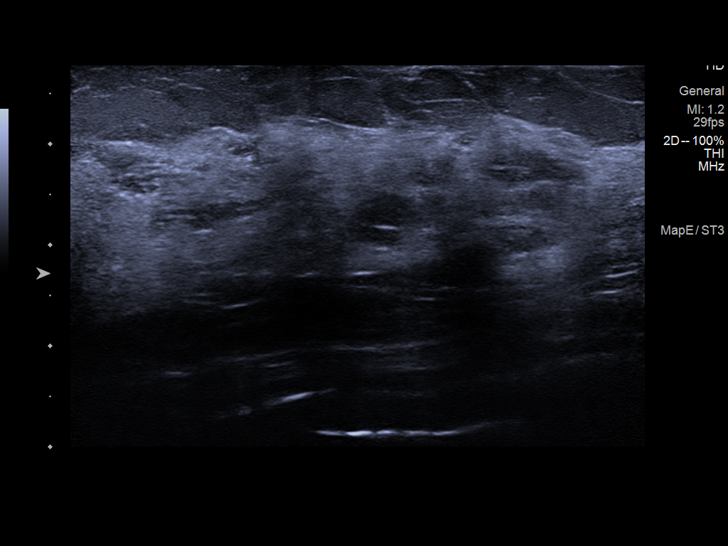
[im 8/11]
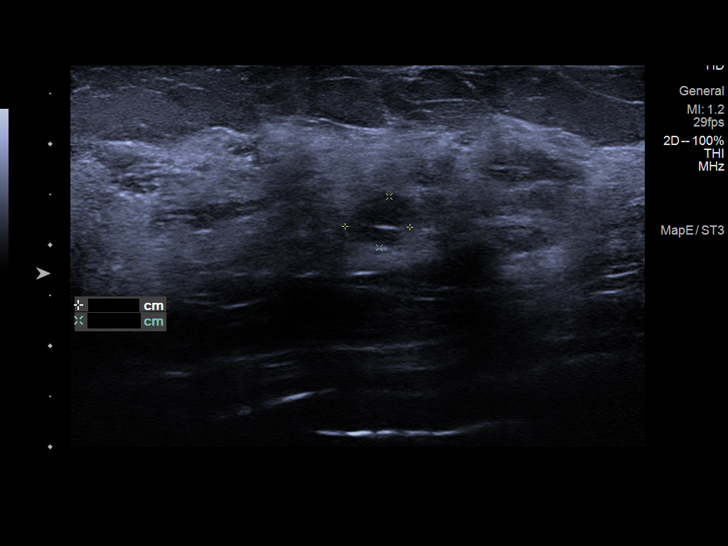
[im 9/11]
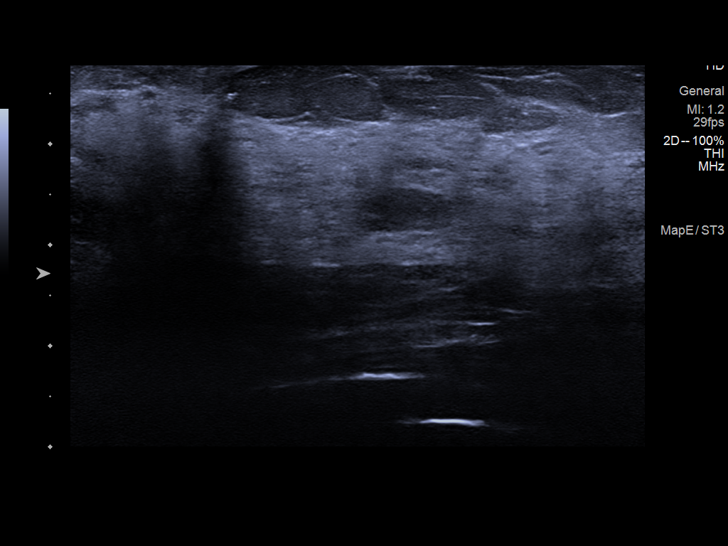
[im 10/11]
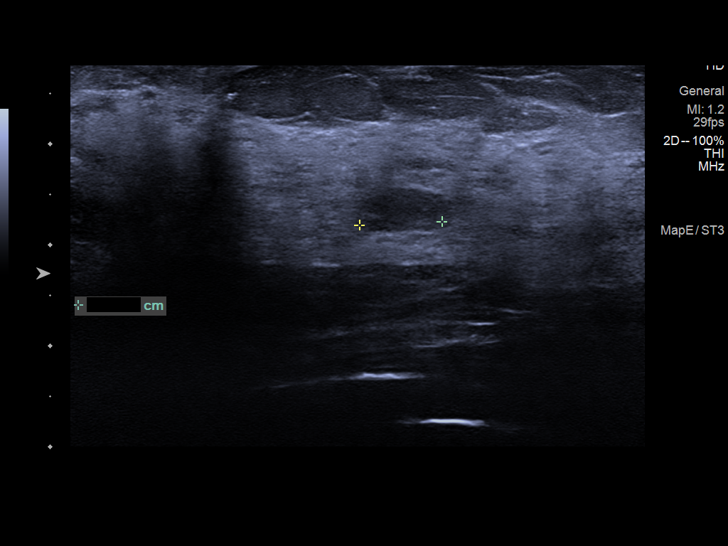
[im 11/11]
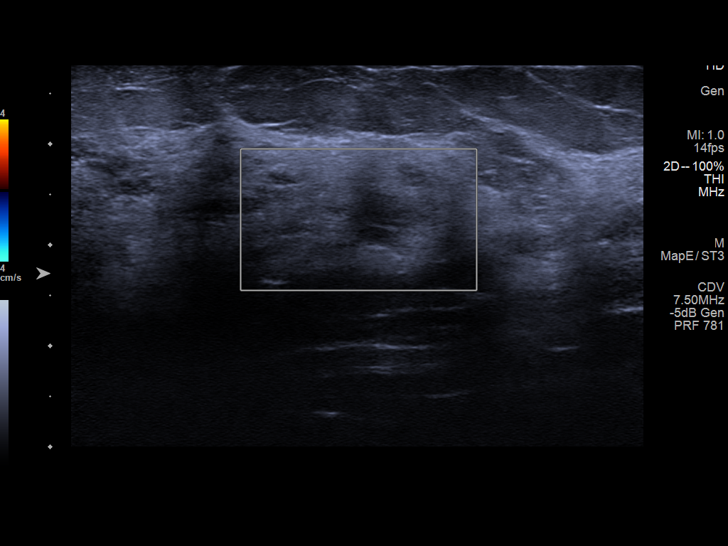

[11 of 11 positions shown; findings below may reference images not displayed]

ACR Breast Density Category c: The breast tissue is heterogeneously
dense, which may obscure small masses.
FINDINGS: No suspicious masses or calcifications seen in the right breast.
There is a focal area of dense fibroglandular tissue in the outer
right breast seen on the CC tomograms which disperses on the MLO
tomograms. There is an oval circumscribed mass measuring
approximately 0.8 cm visualized in the upper central far posterior
left breast.

Targeted ultrasound of the upper-outer quadrant of the right breast
was performed demonstrating extremely dense fibroglandular tissue.
No suspicious masses or abnormality seen in the upper-outer right
breast. In addition, sonographic evaluation of the inner right
breast also demonstrates similarly appearing dense fibroglandular
tissue.

Targeted ultrasound of the entire upper and central left breast was
performed demonstrating several scattered areas of fibrocystic
change. A cluster of complicated cysts in the left breast at 12
o'clock 1 cm from the nipple measures 0.6 x 0.5 x 0.8 cm. This
likely corresponds with the oval mass seen in the left breast at
mammography.
IMPRESSION: 1. Probably benign complicated cyst/cluster of cysts in the left
breast at the 12 position.

2. No mammographic or sonographic abnormalities at the sites of
concern in the right breast, only areas of extremely dense
fibroglandular tissue visualized.

RECOMMENDATION:
Recommend diagnostic mammography and ultrasound of the left breast
in 6 months.

I have discussed the findings and recommendations with the patient.
If applicable, a reminder letter will be sent to the patient
regarding the next appointment.

BI-RADS CATEGORY  3: Probably benign.

## 2022-09-10 ENCOUNTER — Other Ambulatory Visit: Payer: Self-pay

## 2022-10-23 DIAGNOSIS — Z6835 Body mass index (BMI) 35.0-35.9, adult: Secondary | ICD-10-CM | POA: Diagnosis not present

## 2022-10-23 DIAGNOSIS — Z0000 Encounter for general adult medical examination without abnormal findings: Secondary | ICD-10-CM | POA: Diagnosis not present

## 2022-10-23 DIAGNOSIS — R030 Elevated blood-pressure reading, without diagnosis of hypertension: Secondary | ICD-10-CM | POA: Diagnosis not present

## 2022-11-29 DIAGNOSIS — Z1331 Encounter for screening for depression: Secondary | ICD-10-CM | POA: Diagnosis not present

## 2022-11-29 DIAGNOSIS — Z1329 Encounter for screening for other suspected endocrine disorder: Secondary | ICD-10-CM | POA: Diagnosis not present

## 2022-11-29 DIAGNOSIS — Z01419 Encounter for gynecological examination (general) (routine) without abnormal findings: Secondary | ICD-10-CM | POA: Diagnosis not present

## 2022-11-29 DIAGNOSIS — Z13220 Encounter for screening for lipoid disorders: Secondary | ICD-10-CM | POA: Diagnosis not present

## 2022-11-29 DIAGNOSIS — Z0000 Encounter for general adult medical examination without abnormal findings: Secondary | ICD-10-CM | POA: Diagnosis not present

## 2022-11-29 DIAGNOSIS — Z131 Encounter for screening for diabetes mellitus: Secondary | ICD-10-CM | POA: Diagnosis not present

## 2022-12-05 DIAGNOSIS — D72819 Decreased white blood cell count, unspecified: Secondary | ICD-10-CM | POA: Diagnosis not present

## 2022-12-05 DIAGNOSIS — Z8639 Personal history of other endocrine, nutritional and metabolic disease: Secondary | ICD-10-CM | POA: Diagnosis not present

## 2022-12-05 DIAGNOSIS — R030 Elevated blood-pressure reading, without diagnosis of hypertension: Secondary | ICD-10-CM | POA: Diagnosis not present

## 2022-12-05 DIAGNOSIS — Z6835 Body mass index (BMI) 35.0-35.9, adult: Secondary | ICD-10-CM | POA: Diagnosis not present

## 2023-07-04 ENCOUNTER — Telehealth: Admitting: Physician Assistant

## 2023-07-04 DIAGNOSIS — J019 Acute sinusitis, unspecified: Secondary | ICD-10-CM | POA: Diagnosis not present

## 2023-07-04 DIAGNOSIS — B9689 Other specified bacterial agents as the cause of diseases classified elsewhere: Secondary | ICD-10-CM | POA: Diagnosis not present

## 2023-07-04 MED ORDER — AMOXICILLIN-POT CLAVULANATE 875-125 MG PO TABS: 1.0000 | ORAL_TABLET | Freq: Two times a day (BID) | ORAL | 0 refills | Status: DC

## 2023-07-04 NOTE — Progress Notes (Signed)

## 2023-07-28 ENCOUNTER — Telehealth: Admitting: Physician Assistant

## 2023-07-28 DIAGNOSIS — B379 Candidiasis, unspecified: Secondary | ICD-10-CM

## 2023-07-28 DIAGNOSIS — T3695XA Adverse effect of unspecified systemic antibiotic, initial encounter: Secondary | ICD-10-CM

## 2023-07-28 MED ORDER — FLUCONAZOLE 150 MG PO TABS: 150.0000 mg | ORAL_TABLET | ORAL | 0 refills | Status: DC | PRN

## 2023-07-28 NOTE — Progress Notes (Signed)

## 2023-12-30 ENCOUNTER — Telehealth: Admitting: Family Medicine

## 2023-12-30 DIAGNOSIS — B001 Herpesviral vesicular dermatitis: Secondary | ICD-10-CM | POA: Diagnosis not present

## 2023-12-30 MED ORDER — VALACYCLOVIR HCL 1 G PO TABS: 2000.0000 mg | ORAL_TABLET | Freq: Two times a day (BID) | ORAL | 0 refills | Status: AC

## 2023-12-30 NOTE — Progress Notes (Signed)
 We are sorry that you are not feeling well.  Here is how we plan to help!  Based on what you have shared with me it does look like you have a viral infection.    Most cold sores or fever blisters are small fluid filled blisters around the mouth caused by herpes simplex virus.  The most common strain of the virus causing cold sores is herpes simplex virus 1.  It can be spread by skin contact, sharing eating utensils, or even sharing towels.  Cold sores are contagious to other people until dry. (Approximately 5-7 days).  Wash your hands. You can spread the virus to your eyes through handling your contact lenses after touching the lesions.  Most people experience pain at the sight or tingling sensations in their lips that may begin before the ulcers erupt.  Herpes simplex is treatable but not curable.  It may lie dormant for a long time and then reappear due to stress or prolonged sun exposure.  Many patients have success in treating their cold sores with an over the counter topical called Abreva.  You may apply the cream up to 5 times daily (maximum 10 days) until healing occurs.  If you would like to use an oral antiviral medication to speed the healing of your cold sore, I have sent a prescription to your local pharmacy Valacyclovir  2 gm take one by mouth twice a day for 1 day    HOME CARE:  Wash your hands frequently. Do not pick at or rub the sore. Don't open the blisters. Avoid kissing other people during this time. Avoid sharing drinking glasses, eating utensils, or razors. Do not handle contact lenses unless you have thoroughly washed your hands with soap and warm water! Avoid oral sex during this time.  Herpes from sores on your mouth can spread to your partner's genital area. Avoid contact with anyone who has eczema or a weakened immune system. Cold sores are often triggered by exposure to intense sunlight, use a lip balm containing a sunscreen (SPF 30 or higher).  GET HELP RIGHT AWAY  IF:  Blisters look infected. Blisters occur near or in the eye. Symptoms last longer than 10 days. Your symptoms become worse.  MAKE SURE YOU:  Understand these instructions. Will watch your condition. Will get help right away if you are not doing well or get worse.    Your e-visit answers were reviewed by a board certified advanced clinical practitioner to complete your personal care plan.  Depending upon the condition, your plan could have  Included both over the counter or prescription medications.    Please review your pharmacy choice.  Be sure that the pharmacy you have chosen is open so that you can pick up your prescription now.  If there is a problem you can message your provider in MyChart to have the prescription routed to another pharmacy.    Your safety is important to us .  If you have drug allergies check our prescription carefully.  For the next 24 hours you can use MyChart to ask questions about today's visit, request a non-urgent call back, or ask for a work or school excuse from your e-visit provider.  You will get an email in the next two days asking about your experience.  I hope that your e-visit has been valuable and will speed your recovery.   I have spent 5 minutes in review of e-visit questionnaire, review and updating patient chart, medical decision making and response to patient.  Chiquita CHRISTELLA Barefoot, NP

## 2024-01-27 ENCOUNTER — Other Ambulatory Visit (HOSPITAL_BASED_OUTPATIENT_CLINIC_OR_DEPARTMENT_OTHER): Payer: Self-pay

## 2024-01-27 DIAGNOSIS — L400 Psoriasis vulgaris: Secondary | ICD-10-CM | POA: Diagnosis not present

## 2024-01-27 DIAGNOSIS — D485 Neoplasm of uncertain behavior of skin: Secondary | ICD-10-CM | POA: Diagnosis not present

## 2024-01-27 MED ORDER — CLOBETASOL PROPIONATE 0.05 % EX OINT
TOPICAL_OINTMENT | CUTANEOUS | 11 refills | Status: DC
  Filled 2024-01-27: qty 60, 30d supply, fill #0

## 2024-01-28 ENCOUNTER — Other Ambulatory Visit (HOSPITAL_BASED_OUTPATIENT_CLINIC_OR_DEPARTMENT_OTHER): Payer: Self-pay

## 2024-02-03 ENCOUNTER — Encounter (HOSPITAL_COMMUNITY): Payer: Self-pay | Admitting: Obstetrics & Gynecology

## 2024-02-05 ENCOUNTER — Inpatient Hospital Stay (HOSPITAL_COMMUNITY): Admission: RE | Admit: 2024-02-05

## 2024-02-05 DIAGNOSIS — Z1231 Encounter for screening mammogram for malignant neoplasm of breast: Secondary | ICD-10-CM

## 2024-02-09 ENCOUNTER — Other Ambulatory Visit (HOSPITAL_COMMUNITY): Payer: Self-pay | Admitting: Obstetrics & Gynecology

## 2024-02-09 DIAGNOSIS — N6489 Other specified disorders of breast: Secondary | ICD-10-CM

## 2024-02-13 ENCOUNTER — Other Ambulatory Visit: Payer: Self-pay | Admitting: General Surgery

## 2024-02-13 DIAGNOSIS — C4361 Malignant melanoma of right upper limb, including shoulder: Secondary | ICD-10-CM | POA: Diagnosis not present

## 2024-02-17 ENCOUNTER — Ambulatory Visit (HOSPITAL_COMMUNITY)
Admission: RE | Admit: 2024-02-17 | Discharge: 2024-02-17 | Disposition: A | Discharge status: Home / Self Care | Source: Ambulatory Visit | Attending: Obstetrics & Gynecology | Admitting: Obstetrics & Gynecology

## 2024-02-17 ENCOUNTER — Encounter (HOSPITAL_COMMUNITY): Payer: Self-pay | Admitting: General Surgery

## 2024-02-17 ENCOUNTER — Other Ambulatory Visit: Payer: Self-pay

## 2024-02-17 DIAGNOSIS — R928 Other abnormal and inconclusive findings on diagnostic imaging of breast: Secondary | ICD-10-CM | POA: Diagnosis not present

## 2024-02-17 DIAGNOSIS — N6459 Other signs and symptoms in breast: Secondary | ICD-10-CM | POA: Diagnosis not present

## 2024-02-17 DIAGNOSIS — N6489 Other specified disorders of breast: Secondary | ICD-10-CM | POA: Diagnosis not present

## 2024-02-17 DIAGNOSIS — R92333 Mammographic heterogeneous density, bilateral breasts: Secondary | ICD-10-CM | POA: Diagnosis not present

## 2024-02-17 NOTE — Progress Notes (Addendum)
 SDW CALL  Patient was given pre-op instructions over the phone. The opportunity was given for the patient to ask questions. No further questions asked. Patient verbalized understanding of instructions given.   PCP - Jessica Levine -[place], [place] Cardiologist - denies  PPM/ICD - denies Device Orders -  Rep Notified -   Chest x-ray - na EKG - na Stress Test - denies ECHO - denies Cardiac Cath - denies  Sleep Study - denies CPAP -   Fasting Blood Sugar - na Checks Blood Sugar _____ times a day  Blood Thinner Instructions:na Aspirin Instructions:na  ERAS Protcol -clears until 1015 PRE-SURGERY Ensure or G2- no  COVID TEST- na   Anesthesia review: no  Patient denies shortness of breath, fever, cough and chest pain over the phone call  Special instructions:    Oral Hygiene is also important to reduce your risk of infection.  Remember - BRUSH YOUR TEETH THE MORNING OF SURGERY WITH YOUR REGULAR TOOTHPASTE

## 2024-02-20 ENCOUNTER — Ambulatory Visit (HOSPITAL_COMMUNITY): Admitting: Anesthesiology

## 2024-02-20 ENCOUNTER — Other Ambulatory Visit: Payer: Self-pay

## 2024-02-20 ENCOUNTER — Encounter (HOSPITAL_COMMUNITY): Payer: Self-pay | Admitting: General Surgery

## 2024-02-20 ENCOUNTER — Ambulatory Visit (HOSPITAL_COMMUNITY)
Admission: RE | Admit: 2024-02-20 | Discharge: 2024-02-20 | Disposition: A | Discharge status: Home / Self Care | Attending: General Surgery | Admitting: General Surgery

## 2024-02-20 ENCOUNTER — Encounter (HOSPITAL_COMMUNITY)
Admission: RE | Disposition: A | Discharge status: Home / Self Care | Payer: Self-pay | Source: Home / Self Care | Attending: General Surgery

## 2024-02-20 ENCOUNTER — Other Ambulatory Visit (HOSPITAL_BASED_OUTPATIENT_CLINIC_OR_DEPARTMENT_OTHER): Payer: Self-pay

## 2024-02-20 DIAGNOSIS — C4361 Malignant melanoma of right upper limb, including shoulder: Secondary | ICD-10-CM

## 2024-02-20 HISTORY — PX: EXCISION, MASS, UPPER EXTREMITY: SHX7567

## 2024-02-20 LAB — CBC
HCT: 39.8 % (ref 36.0–46.0)
Hemoglobin: 13.3 g/dL (ref 12.0–15.0)
MCH: 28.6 pg (ref 26.0–34.0)
MCHC: 33.4 g/dL (ref 30.0–36.0)
MCV: 85.6 fL (ref 80.0–100.0)
Platelets: 244 K/uL (ref 150–400)
RBC: 4.65 MIL/uL (ref 3.87–5.11)
RDW: 14.6 % (ref 11.5–15.5)
WBC: 5 K/uL (ref 4.0–10.5)
nRBC: 0 % (ref 0.0–0.2)

## 2024-02-20 LAB — POCT PREGNANCY, URINE: Preg Test, Ur: NEGATIVE

## 2024-02-20 SURGERY — EXCISION, MASS, UPPER EXTREMITY
Anesthesia: Monitor Anesthesia Care | Site: Arm Upper | Laterality: Right

## 2024-02-20 MED ORDER — FENTANYL CITRATE (PF) 100 MCG/2ML IJ SOLN
INTRAMUSCULAR | Status: AC
  Filled 2024-02-20: qty 2

## 2024-02-20 MED ORDER — HYDROMORPHONE HCL 1 MG/ML IJ SOLN
INTRAMUSCULAR | Status: DC | PRN
  Administered 2024-02-20: 1 mg via INTRAVENOUS

## 2024-02-20 MED ORDER — LIDOCAINE HCL (PF) 1 % IJ SOLN
INTRAMUSCULAR | Status: AC
  Filled 2024-02-20: qty 30

## 2024-02-20 MED ORDER — LIDOCAINE HCL 1 % IJ SOLN
INTRAMUSCULAR | Status: DC | PRN
  Administered 2024-02-20: 30 mL via INTRAMUSCULAR

## 2024-02-20 MED ORDER — OXYCODONE HCL 5 MG/5ML PO SOLN: 5.0000 mg | Freq: Once | ORAL | Status: DC | PRN

## 2024-02-20 MED ORDER — CHLORHEXIDINE GLUCONATE CLOTH 2 % EX PADS: 6.0000 | MEDICATED_PAD | Freq: Once | CUTANEOUS | Status: DC

## 2024-02-20 MED ORDER — CHLORHEXIDINE GLUCONATE 0.12 % MT SOLN
15.0000 mL | Freq: Once | OROMUCOSAL | Status: AC
  Administered 2024-02-20: 15 mL via OROMUCOSAL
  Filled 2024-02-20: qty 15

## 2024-02-20 MED ORDER — LACTATED RINGERS IV SOLN: INTRAVENOUS | Status: DC

## 2024-02-20 MED ORDER — OXYCODONE HCL 5 MG PO TABS: 5.0000 mg | ORAL_TABLET | Freq: Once | ORAL | Status: DC | PRN

## 2024-02-20 MED ORDER — BUPIVACAINE-EPINEPHRINE (PF) 0.25% -1:200000 IJ SOLN
INTRAMUSCULAR | Status: AC
  Filled 2024-02-20: qty 30

## 2024-02-20 MED ORDER — DEXAMETHASONE SOD PHOSPHATE PF 10 MG/ML IJ SOLN
INTRAMUSCULAR | Status: DC | PRN
  Administered 2024-02-20: 10 mg via INTRAVENOUS

## 2024-02-20 MED ORDER — ORAL CARE MOUTH RINSE: 15.0000 mL | Freq: Once | OROMUCOSAL | Status: AC

## 2024-02-20 MED ORDER — DROPERIDOL 2.5 MG/ML IJ SOLN: 0.6250 mg | Freq: Once | INTRAMUSCULAR | Status: DC | PRN

## 2024-02-20 MED ORDER — 0.9 % SODIUM CHLORIDE (POUR BTL) OPTIME
TOPICAL | Status: DC | PRN
  Administered 2024-02-20: 1000 mL

## 2024-02-20 MED ORDER — LIDOCAINE 2% (20 MG/ML) 5 ML SYRINGE
INTRAMUSCULAR | Status: DC | PRN
  Administered 2024-02-20: 100 mg via INTRAVENOUS

## 2024-02-20 MED ORDER — OXYCODONE HCL 5 MG PO TABS
5.0000 mg | ORAL_TABLET | Freq: Four times a day (QID) | ORAL | 0 refills | Status: AC | PRN
  Filled 2024-02-20: qty 15, 4d supply, fill #0

## 2024-02-20 MED ORDER — CEFAZOLIN SODIUM-DEXTROSE 2-4 GM/100ML-% IV SOLN
2.0000 g | INTRAVENOUS | Status: AC
  Administered 2024-02-20: 2 g via INTRAVENOUS
  Filled 2024-02-20: qty 100

## 2024-02-20 MED ORDER — FENTANYL CITRATE (PF) 250 MCG/5ML IJ SOLN
INTRAMUSCULAR | Status: DC | PRN
  Administered 2024-02-20 (×2): 50 ug via INTRAVENOUS

## 2024-02-20 MED ORDER — DEXMEDETOMIDINE HCL IN NACL 80 MCG/20ML IV SOLN
INTRAVENOUS | Status: DC | PRN
  Administered 2024-02-20: 12 ug via INTRAVENOUS

## 2024-02-20 MED ORDER — ROCURONIUM BROMIDE 10 MG/ML (PF) SYRINGE
PREFILLED_SYRINGE | INTRAVENOUS | Status: DC | PRN
  Administered 2024-02-20: 20 mg via INTRAVENOUS
  Administered 2024-02-20: 50 mg via INTRAVENOUS

## 2024-02-20 MED ORDER — MIDAZOLAM HCL 2 MG/2ML IJ SOLN
INTRAMUSCULAR | Status: AC
  Filled 2024-02-20: qty 2

## 2024-02-20 MED ORDER — FENTANYL CITRATE (PF) 100 MCG/2ML IJ SOLN: 25.0000 ug | INTRAMUSCULAR | Status: DC | PRN

## 2024-02-20 MED ORDER — HYDROMORPHONE HCL 1 MG/ML IJ SOLN
INTRAMUSCULAR | Status: AC
  Filled 2024-02-20: qty 1

## 2024-02-20 MED ORDER — ONDANSETRON HCL 4 MG/2ML IJ SOLN
INTRAMUSCULAR | Status: DC | PRN
  Administered 2024-02-20: 4 mg via INTRAVENOUS

## 2024-02-20 MED ORDER — SUGAMMADEX SODIUM 200 MG/2ML IV SOLN
INTRAVENOUS | Status: DC | PRN
  Administered 2024-02-20 (×2): 200 mg via INTRAVENOUS

## 2024-02-20 MED ORDER — PROPOFOL 10 MG/ML IV BOLUS
INTRAVENOUS | Status: DC | PRN
  Administered 2024-02-20: 40 mg via INTRAVENOUS
  Administered 2024-02-20: 200 mg via INTRAVENOUS

## 2024-02-20 MED ORDER — MIDAZOLAM HCL (PF) 2 MG/2ML IJ SOLN
INTRAMUSCULAR | Status: DC | PRN
  Administered 2024-02-20: 2 mg via INTRAVENOUS

## 2024-02-20 MED ORDER — PROPOFOL 500 MG/50ML IV EMUL
INTRAVENOUS | Status: DC | PRN
  Administered 2024-02-20: 125 ug/kg/min via INTRAVENOUS

## 2024-02-20 MED ORDER — LACTATED RINGERS IV SOLN: INTRAVENOUS | Status: DC | PRN

## 2024-02-20 MED ORDER — ACETAMINOPHEN 500 MG PO TABS
1000.0000 mg | ORAL_TABLET | Freq: Once | ORAL | Status: AC
  Administered 2024-02-20: 1000 mg via ORAL
  Filled 2024-02-20: qty 2

## 2024-02-20 SURGICAL SUPPLY — 26 items
BENZOIN TINCTURE PRP APPL 2/3 (GAUZE/BANDAGES/DRESSINGS) IMPLANT
CANISTER SUCTION 3000ML PPV (SUCTIONS) ×1 IMPLANT
CHLORAPREP W/TINT 26 (MISCELLANEOUS) ×1 IMPLANT
COVER SURGICAL LIGHT HANDLE (MISCELLANEOUS) ×1 IMPLANT
DRSG TEGADERM 4X4.75 (GAUZE/BANDAGES/DRESSINGS) IMPLANT
ELECTRODE REM PT RTRN 9FT ADLT (ELECTROSURGICAL) ×1 IMPLANT
GAUZE SPONGE 4X4 12PLY STRL (GAUZE/BANDAGES/DRESSINGS) IMPLANT
GLOVE BIO SURGEON STRL SZ 6 (GLOVE) ×1 IMPLANT
GLOVE INDICATOR 6.5 STRL GRN (GLOVE) ×1 IMPLANT
GOWN STRL REUS W/ TWL LRG LVL3 (GOWN DISPOSABLE) ×1 IMPLANT
GOWN STRL REUS W/ TWL XL LVL3 (GOWN DISPOSABLE) ×1 IMPLANT
KIT BASIN OR (CUSTOM PROCEDURE TRAY) ×1 IMPLANT
KIT TURNOVER KIT B (KITS) ×1 IMPLANT
MARKER SKIN DUAL TIP RULER LAB (MISCELLANEOUS) ×1 IMPLANT
NDL HYPO 25GX1X1/2 BEV (NEEDLE) ×2 IMPLANT
NEEDLE HYPO 25GX1X1/2 BEV (NEEDLE) ×2 IMPLANT
PACK GENERAL/GYN (CUSTOM PROCEDURE TRAY) ×1 IMPLANT
PAD ARMBOARD POSITIONER FOAM (MISCELLANEOUS) ×2 IMPLANT
SOLN 0.9% NACL POUR BTL 1000ML (IV SOLUTION) ×1 IMPLANT
STRIP CLOSURE SKIN 1/2X4 (GAUZE/BANDAGES/DRESSINGS) IMPLANT
SUT ETHILON 2 0 FS 18 (SUTURE) ×1 IMPLANT
SUT MNCRL AB 4-0 PS2 18 (SUTURE) ×1 IMPLANT
SUT SILK 2 0 PERMA HAND 18 BK (SUTURE) IMPLANT
SUT VIC AB 3-0 SH 8-18 (SUTURE) IMPLANT
SYR CONTROL 10ML LL (SYRINGE) ×2 IMPLANT
TOWEL GREEN STERILE FF (TOWEL DISPOSABLE) ×1 IMPLANT

## 2024-02-20 NOTE — Interval H&P Note (Signed)
 History and Physical Interval Note:  02/20/2024 12:32 PM  Jessica Levine  has presented today for surgery, with the diagnosis of RIGHT UPPER ARM MELANOMA.  The various methods of treatment have been discussed with the patient and family. After consideration of risks, benefits and other options for treatment, the patient has consented to  Procedures with comments: EXCISION, MASS, UPPER EXTREMITY (Right) - WIDE LOCA EXCISION RIGHT UPPER ARM MELANOMA ADVANCEMENT FLAP CLOSURE as a surgical intervention.  The patient's history has been reviewed, patient examined, no change in status, stable for surgery.  I have reviewed the patient's chart and labs.  Questions were answered to the patient's satisfaction.     Jina Nephew

## 2024-02-20 NOTE — Anesthesia Procedure Notes (Signed)
 Procedure Name: Intubation Date/Time: 02/20/2024 1:45 PM  Performed by: Mollie Olivia SAUNDERS, CRNAPre-anesthesia Checklist: Patient identified, Emergency Drugs available, Suction available and Patient being monitored Patient Re-evaluated:Patient Re-evaluated prior to induction Oxygen Delivery Method: Circle system utilized Preoxygenation: Pre-oxygenation with 100% oxygen Induction Type: IV induction Ventilation: Mask ventilation without difficulty Laryngoscope Size: Glidescope and 3 Tube type: Oral Tube size: 7.0 mm Number of attempts: 1 Airway Equipment and Method: Video-laryngoscopy and Rigid stylet Placement Confirmation: ETT inserted through vocal cords under direct vision, positive ETCO2 and breath sounds checked- equal and bilateral Secured at: 22 cm Tube secured with: Tape Dental Injury: Teeth and Oropharynx as per pre-operative assessment

## 2024-02-20 NOTE — Op Note (Signed)
 PRE-OPERATIVE DIAGNOSIS: cT1a right upper posterior arm melanoma  POST-OPERATIVE DIAGNOSIS:  Same  PROCEDURE:  Procedure(s): Wide local excision 1 cm margins, advancement flap closure for defect 9.6 x 5.4 cm  SURGEON:  Surgeon(s): Jina Nephew, MD  ANESTHESIA:   local and general  DRAINS: none   LOCAL MEDICATIONS USED:  MARCAINE     and XYLOCAINE    SPECIMEN:  Source of Specimen:  wide local excision right posterior upper arm melanoma   FINDINGS:  no gross residual disease  DISPOSITION OF SPECIMEN:  PATHOLOGY  COUNTS:  YES  PLAN OF CARE: Discharge to home after PACU  PATIENT DISPOSITION:  PACU - hemodynamically stable.    PROCEDURE:   Pt was identified in the holding area, taken to the OR, and placed supine on the OR table.  General anesthesia was induced.  Time out was performed according to the surgical safety checklist.  When all was correct, we continued.     The patient was placed into the supine position with the right shoulder bumped.  The right arm, axilla, shoulder and chest were prepped and draped in sterile fashion.  The melanoma was identified and 1 cm margins were marked out. This was then marked into an ellipse.  Local was administered under the melanoma and the adjacent tissue.  A #10 blade was used to incise the skin around the melanoma.  The cautery was used to take the dissection down to the fascia.  The skin was marked in situ with orientation sutures.  The cautery was used to take the specimen off the fascia, and it was passed off the table.    Skin hooks were used to elevate the edges of the incision and the skin was freed up in all directions with the cautery to make advancement flaps This was pulled together in a longitudinal orientation. The skin was pulled together to check the tension. . Deep interrupted 2-0 vicryl sutures were placed to relieve tension.  The skin was then reapproximated with 3-0 interrupted vicryl deep dermal sutures and 4-0 monocryl running  subcuticular sutures.    The melanoma site was cleaned, dried, and dressed with Benzoin, steristrips, gauze, and tegaderm.    Needle, sponge, and instrument counts were correct.  The patient was awakened from anesthesia and taken to the PACU in stable condition.

## 2024-02-20 NOTE — Discharge Instructions (Addendum)
 [facility] Office Phone Number [phone]   POST OP INSTRUCTIONS  Always review your discharge instruction sheet given to you by the facility where your surgery was performed.  IF YOU HAVE DISABILITY OR FAMILY LEAVE FORMS, YOU MUST BRING THEM TO THE OFFICE FOR PROCESSING.  DO NOT GIVE THEM TO YOUR DOCTOR.  Take 2 tylenol  (acetominophen) three times a day for 3 days.  If you still have pain, add ibuprofen  with food in between if able to take this (if you have kidney issues or stomach issues, do not take ibuprofen ).  If both of those are not enough, add the narcotic pain pill.  If you find you are needing a lot of this overnight after surgery, call the next morning for a refill.   Take your usually prescribed medications unless otherwise directed If you need a refill on your pain medication, please contact your pharmacy.  They will contact our office to request authorization.  Prescriptions will not be filled after 5pm or on week-ends. You should eat very light the first 24 hours after surgery, such as soup, crackers, pudding, etc.  Resume your normal diet the day after surgery It is common to experience some constipation if taking pain medication after surgery.  Increasing fluid intake and taking a stool softener will usually help or prevent this problem from occurring.  A mild laxative (Milk of Magnesia or Miralax) should be taken according to package directions if there are no bowel movements after 48 hours. You may shower in 48 hours.  The surgical glue will flake off in 2-3 weeks.   ACTIVITIES:  No strenuous activity or heavy lifting for 2 weeks.   You may drive when you no longer are taking prescription pain medication, you can comfortably wear a seatbelt, and you can safely maneuver your car and apply brakes. RETURN TO WORK:  __________as tolerated if no lifting x 2 weeks. _______________ Jessica Levine should see your doctor in the office for a follow-up appointment approximately  three-four weeks after your surgery.    WHEN TO CALL YOUR DOCTOR: Fever over 101.0 Nausea and/or vomiting. Extreme swelling or bruising. Continued bleeding from incision. Increased pain, redness, or drainage from the incision.  The clinic staff is available to answer your questions during regular business hours.  Please don't hesitate to call and ask to speak to one of the nurses for clinical concerns.  If you have a medical emergency, go to the nearest emergency room or call 911.  A surgeon from [facility] is always on call at the hospital.  For further questions, please visit [email or web address]

## 2024-02-20 NOTE — H&P (Signed)
 " Chief Complaint  Patient presents with  New Consultation  Patient Care Team: Kayla Franky SQUIBB, MD as PCP - General (Family Medicine) Aron Jina Mckusick, MD as Consulting Provider (Surgical Oncology) Porter Spanner, [place] (Dermatology)   Subjective   Jessica Levine is a 43 y.o. female who presents for New Consultation HPI History of Present Illness Jessica Levine is a 43 year old female with recently diagnosed thin malignant melanoma of the right upper arm who presents for surgical consultation regarding wide local excision.  She identified a mole on the posterior right upper arm that appeared darker than her other nevi, without noted change in size or other features. She missed her annual dermatology visit the previous year but otherwise attends routine skin checks. She has no personal or family history of melanoma or other malignancy.  Dermatologic evaluation led to biopsy, which demonstrated malignant melanoma with a Breslow thickness of 0.5 mm (T1a). The peripheral margin was positive and the deep margin was negative. There was no lymphovascular or perineural invasion, and no microsatellitosis. She has not undergone advanced imaging or lymph node biopsy. She has not had prior surgical intervention.  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 She expresses anxiety regarding her diagnosis and the upcoming procedure. She describes typical childhood sun exposure with occasional sunburns, is prone to sunburn, and currently works indoors. She reports consistent sunscreen use.  Pathology from [facility] , Daniel Mcalpine Accession number [id number]  Review of Systems  All other systems reviewed and are negative.  Patient Active Problem List  Diagnosis  Malignant melanoma of skin of upper limb, including shoulder, right (CMS/HHS-HCC)  Gastroesophageal reflux disease  Vitamin D  deficiency   No outpatient medications prior to visit.   No  facility-administered medications prior to visit.     Objective   Vitals:  02/13/24 0934 02/13/24 0935  BP: 130/83  Pulse: 95  Temp: 37.7 C (99.8 F)  SpO2: 95%  Weight: (!) 101 kg (222 lb 9.6 oz)  Height: 162.6 cm (5' 4)  PainSc: 0-No pain   Body mass index is 38.21 kg/m.  Home Vitals:   Physical Exam Physical Exam  Head: Normocephalic and atraumatic.  Eyes: Conjunctivae are normal. Pupils are equal, round, and reactive to light. No scleral icterus.  Neck: Normal range of motion. Neck supple. No tracheal deviation present. No thyromegaly present.  Resp: No respiratory distress, normal effort. Skin: Scab on the posterior right upper right arm. This is around 4 cm from the axillary fold but definitely on the posterior arm. No other lesions seen. No evidence of chronic skin damage. Abd: Abdomen is soft, non distended and non tender. No masses are palpable. There is no rebound and no guarding.  Neurological: Alert and oriented to person, place, and time. Coordination normal.  Skin: Skin is warm and dry. No rash noted. No diaphoretic. No erythema. No pallor.  Psychiatric: Normal mood and affect. Normal behavior. Judgment and thought content normal.  Lymphatic: No evidence of cervical, supra or infraclavicular, axillary, periauricular, or occipital adenopathy.  Results Pathology Skin biopsy, upper limb: Malignant melanoma, T1a, thickness 0.5 mm, negative deep margin, positive peripheral margin, no lymphovascular invasion, no perineural invasion, no microsatellitosis    Assessment/Plan:   Assessment & Plan Malignant melanoma of the right upper arm, T1a Thin malignant melanoma (T1a, 0.5 mm) without poor prognostic features. Negative deep margin, positive peripheral margin without deeper invasion concern. Low recurrence risk; higher risk for new melanoma. No metastatic disease. Lymph  node biopsy and PET scan not indicated. - Coordinated surveillance for recurrence and new  primary melanomas with dermatology.  Planned wide local excision of melanoma Wide local excision indicated for T1a melanoma. Procedure involves 1 cm margin down to muscle with elliptical closure. Risks include wound complications, numbness, and tightness. Recurrence risk very low; further surgery unlikely unless residual melanoma found. General anesthesia or sedation planned. Discussed alternatives and margin rationale. - Marked excision as urgent for scheduling and preauthorization. - Attempted to schedule surgery within 2 weeks, targeting completion within 1 month. - Planned wide local excision with 1 cm margins down to muscle and elliptical closure under general anesthesia or sedation. - Planned to send excised tissue for pathological evaluation of residual melanoma. - Provided anticipatory guidance regarding post-operative numbness, tightness, wound healing, and low risk of wound complications and recurrence. - Planned post-operative follow-up and coordination with pathology for results. Diagnoses and all orders for this visit:  Malignant melanoma of skin of upper limb, including shoulder, right (CMS/HHS-HCC)  This visit was coded based on medical decision making (MDM).  "

## 2024-02-20 NOTE — Anesthesia Preprocedure Evaluation (Addendum)
"                                    Anesthesia Evaluation  Patient identified by MRN, date of birth, ID band Patient awake    Reviewed: Allergy & Precautions, NPO status , Patient's Chart, lab work & pertinent test results  Airway Mallampati: I  TM Distance: >3 FB Neck ROM: Full    Dental no notable dental hx. (+) Dental Advisory Given, Teeth Intact   Pulmonary neg pulmonary ROS   Pulmonary exam normal breath sounds clear to auscultation       Cardiovascular negative cardio ROS Normal cardiovascular exam Rhythm:Regular Rate:Normal     Neuro/Psych negative neurological ROS     GI/Hepatic Neg liver ROS,GERD  ,,  Endo/Other  negative endocrine ROS    Renal/GU negative Renal ROS     Musculoskeletal negative musculoskeletal ROS (+)    Abdominal  (+) + obese  Peds  Hematology negative hematology ROS (+)   Anesthesia Other Findings   Reproductive/Obstetrics negative OB ROS                              Anesthesia Physical Anesthesia Plan  ASA: 2  Anesthesia Plan: General and MAC   Post-op Pain Management: Tylenol  PO (pre-op)* and Toradol IV (intra-op)*   Induction: Intravenous  PONV Risk Score and Plan: 3 and Ondansetron , Dexamethasone , Treatment may vary due to age or medical condition and Midazolam   Airway Management Planned: LMA  Additional Equipment:   Intra-op Plan:   Post-operative Plan: Extubation in OR  Informed Consent: I have reviewed the patients History and Physical, chart, labs and discussed the procedure including the risks, benefits and alternatives for the proposed anesthesia with the patient or authorized representative who has indicated his/her understanding and acceptance.     Dental advisory given  Plan Discussed with: CRNA  Anesthesia Plan Comments:          Anesthesia Quick Evaluation  "

## 2024-02-20 NOTE — Transfer of Care (Signed)
 Immediate Anesthesia Transfer of Care Note  Patient: Jessica Levine  Procedure(s) Performed: EXCISION, MASS, UPPER EXTREMITY (Right: Arm Upper)  Patient Location: PACU  Anesthesia Type:General  Level of Consciousness: awake, alert , and oriented  Airway & Oxygen Therapy: Patient Spontanous Breathing and Patient connected to face mask oxygen  Post-op Assessment: Report given to RN, Post -op Vital signs reviewed and stable, and Patient moving all extremities X 4  Post vital signs: Reviewed and stable  Last Vitals:  Vitals Value Taken Time  BP 134/64 02/20/24 14:52  Temp 36.6 C 02/20/24 14:52  Pulse 75 02/20/24 15:00  Resp 13 02/20/24 15:00  SpO2 97 % 02/20/24 15:00  Vitals shown include unfiled device data.  Last Pain:  Vitals:   02/20/24 1452  TempSrc:   PainSc: 0-No pain      Patients Stated Pain Goal: 0 (02/20/24 1056)  Complications: No notable events documented.

## 2024-02-21 NOTE — Anesthesia Postprocedure Evaluation (Signed)
"   Anesthesia Post Note  Patient: Jessica Levine  Procedure(s) Performed: EXCISION, MASS, UPPER EXTREMITY (Right: Arm Upper)     Patient location during evaluation: PACU Anesthesia Type: MAC Level of consciousness: awake and alert Pain management: pain level controlled Vital Signs Assessment: post-procedure vital signs reviewed and stable Respiratory status: spontaneous breathing Cardiovascular status: stable Anesthetic complications: no   No notable events documented.  Last Vitals:  Vitals:   02/20/24 1530 02/20/24 1536  BP: (!) 148/72 (!) 152/67  Pulse: 72 69  Resp: 15 12  Temp:  36.4 C  SpO2: 99% 99%    Last Pain:  Vitals:   02/20/24 1536  TempSrc:   PainSc: 0-No pain                 [name]      "

## 2024-02-22 ENCOUNTER — Encounter (HOSPITAL_COMMUNITY): Payer: Self-pay | Admitting: General Surgery

## 2024-02-24 LAB — SURGICAL PATHOLOGY

## 2024-02-26 ENCOUNTER — Ambulatory Visit: Payer: Self-pay | Admitting: General Surgery

## 2024-03-16 ENCOUNTER — Ambulatory Visit: Admitting: Physician Assistant
